# Patient Record
Sex: Female | Born: 1975
Health system: Southern US, Community
[De-identification: ages and names within clinical notes are randomized; demographics above are authoritative.]

## PROBLEM LIST (undated history)

## (undated) DIAGNOSIS — F32A Depression, unspecified: Secondary | ICD-10-CM

## (undated) DIAGNOSIS — R51 Headache: Secondary | ICD-10-CM

## (undated) DIAGNOSIS — F329 Major depressive disorder, single episode, unspecified: Secondary | ICD-10-CM

## (undated) DIAGNOSIS — R519 Headache, unspecified: Secondary | ICD-10-CM

## (undated) DIAGNOSIS — F419 Anxiety disorder, unspecified: Secondary | ICD-10-CM

## (undated) HISTORY — PX: TUBAL LIGATION: SHX77

## (undated) HISTORY — DX: Major depressive disorder, single episode, unspecified: F32.9

## (undated) HISTORY — DX: Headache, unspecified: R51.9

## (undated) HISTORY — PX: CHOLECYSTECTOMY: SHX55

## (undated) HISTORY — DX: Anxiety disorder, unspecified: F41.9

## (undated) HISTORY — DX: Depression, unspecified: F32.A

## (undated) HISTORY — DX: Headache: R51

---

## 2011-08-30 ENCOUNTER — Encounter: Payer: Self-pay | Admitting: Family Medicine

## 2011-08-30 ENCOUNTER — Ambulatory Visit (INDEPENDENT_AMBULATORY_CARE_PROVIDER_SITE_OTHER): Payer: Self-pay | Admitting: Family Medicine

## 2011-08-30 DIAGNOSIS — F329 Major depressive disorder, single episode, unspecified: Secondary | ICD-10-CM

## 2011-08-30 DIAGNOSIS — N943 Premenstrual tension syndrome: Secondary | ICD-10-CM

## 2011-08-30 DIAGNOSIS — F909 Attention-deficit hyperactivity disorder, unspecified type: Secondary | ICD-10-CM

## 2011-08-30 DIAGNOSIS — F32A Depression, unspecified: Secondary | ICD-10-CM

## 2011-08-30 DIAGNOSIS — F419 Anxiety disorder, unspecified: Secondary | ICD-10-CM

## 2011-08-30 DIAGNOSIS — F3289 Other specified depressive episodes: Secondary | ICD-10-CM

## 2011-08-30 DIAGNOSIS — F411 Generalized anxiety disorder: Secondary | ICD-10-CM

## 2011-08-30 MED ORDER — CLONAZEPAM 1 MG PO TABS: 0.5000 mg | ORAL_TABLET | Freq: Two times a day (BID) | ORAL | Status: DC | PRN

## 2011-08-30 MED ORDER — CITALOPRAM HYDROBROMIDE 20 MG PO TABS: 20.0000 mg | ORAL_TABLET | Freq: Every day | ORAL | Status: DC

## 2011-08-30 NOTE — Assessment & Plan Note (Signed)
Will try on klonopin 1 mg 1/2to 1 po day to twice a day

## 2011-08-30 NOTE — Patient Instructions (Signed)
Follow up in one month Use celexa 20 mg 1 po day Klonopin 1 mg 1/2 to 1 po daily or twice a day

## 2011-08-30 NOTE — Assessment & Plan Note (Signed)
Since sister did well on Celexa and she did not do well on Wellbutrin will switch to Celexa.

## 2011-08-31 NOTE — Progress Notes (Addendum)
  Subjective:    Patient ID: Katrina Mcclain, female    DOB: 12/09/1975, 35 y.o.   MRN: [id number]  Anxiety Onset was 1 to 5 years ago. The problem has been waxing and waning. Symptoms include decreased concentration, depressed mood, insomnia, nervous/anxious behavior and panic. Patient reports no suicidal ideas. Symptoms occur most days. The severity of symptoms is causing significant distress. The symptoms are aggravated by family issues, social activities and work stress. The quality of sleep is poor.   Risk factors include no known risk factors. Her past medical history is significant for anxiety/panic attacks and depression. Past treatments include non-SSRI antidepressants and benzodiazephines. The treatment provided moderate relief. Prior compliance problems include pharmacy issues and medication issues.  She reports also having a HX of ADHD and 2 of her sons with the condition but she is not on any medication for that now. Also reports PMS and the symptoms of her anxiety are worse when she is on her cycle like she is on now. She also states that her PMS got worse after her last child.  She has been on ativan a sister on xanax which she would like to try an wellbutrin that did nothelp.    Review of Systems  Psychiatric/Behavioral: Positive for sleep disturbance, dysphoric mood and decreased concentration. Negative for suicidal ideas. The patient is nervous/anxious and has insomnia.        Objective:   Physical Exam  Constitutional: She is oriented to person, place, and time. She appears well-developed and well-nourished.  HENT:  Head: Normocephalic and atraumatic.  Neck: Normal range of motion.  Neurological: She is alert and oriented to person, place, and time.  Psychiatric: She has a normal mood and affect.       Patient seemed anxious at time and some what nervous.          Assessment & Plan:  #1 depression #2 anxiety #3 PMS #4 ADHD Follow up in one month Use celexa 20 mg 1  po day Klonopin 1 mg 1/2 to 1 po daily or twice a day GAD  Was 6 + 15 for a total of 21 last 2 columns PHQ-9 6 Outpatient Encounter Prescriptions as of 08/30/2011  Medication Sig Dispense Refill  . ibuprofen (ADVIL,MOTRIN) 800 MG tablet Take 800 mg by mouth every 8 (eight) hours as needed.        . citalopram (CELEXA) 20 MG tablet Take 1 tablet (20 mg total) by mouth daily.  30 tablet  4  . clonazePAM (KLONOPIN) 1 MG tablet Take 0.5 tablets (0.5 mg total) by mouth 2 (two) times daily as needed for anxiety (may increase to a whole tablet if needed).  30 tablet  0

## 2011-09-27 ENCOUNTER — Ambulatory Visit (INDEPENDENT_AMBULATORY_CARE_PROVIDER_SITE_OTHER): Payer: Self-pay | Admitting: Family Medicine

## 2011-09-27 ENCOUNTER — Encounter: Payer: Self-pay | Admitting: Family Medicine

## 2011-09-27 VITALS — BP 107/57 | HR 62 | Ht 67.5 in | Wt 191.0 lb

## 2011-09-27 DIAGNOSIS — N926 Irregular menstruation, unspecified: Secondary | ICD-10-CM

## 2011-09-27 DIAGNOSIS — R32 Unspecified urinary incontinence: Secondary | ICD-10-CM

## 2011-09-27 NOTE — Progress Notes (Signed)
  Subjective:    Patient ID: Katrina Mcclain, female    DOB: [birth year], 35 y.o.   MRN: [id number]  Anxiety Presents for follow-up visit. Patient reports no chest pain, compulsions, decreased concentration, depressed mood, feeling of choking, hyperventilation, irritability, malaise, nausea, nervous/anxious behavior, panic, shortness of breath or suicidal ideas. Symptoms occur rarely. The severity of symptoms is mild. The quality of sleep is good. Nighttime awakenings: none.   Compliance with medications is 76-100%.      Review of Systems  Constitutional: Negative for irritability.  Respiratory: Negative for shortness of breath.   Cardiovascular: Negative for chest pain.  Gastrointestinal: Negative for nausea.  Genitourinary: Positive for frequency and menstrual problem.       Urinary incontenence when active has been noticed as well as some menstural irregularity  Psychiatric/Behavioral: Negative for suicidal ideas and decreased concentration. The patient is not nervous/anxious.   All other systems reviewed and are negative.       Objective:   Physical Exam  Constitutional: She is oriented to person, place, and time. She appears well-developed and well-nourished.  Neurological: She is alert and oriented to person, place, and time.  Skin: Skin is warm and dry.  Psychiatric: She has a normal mood and affect. Her behavior is normal.          Assessment & Plan:  Anxiety and depression doing both better. Will continue w/ both medications  Will have a PE in December or January Will need due to menstrual irregularity to get TSH [facility] LH Estrogen and Progesterone as well as CBC CMP, Lipid panel and A!C.

## 2011-09-27 NOTE — Patient Instructions (Signed)
Try the keglel exerciseAnxiety and Panic Attacks Your caregiver has informed you that you are having an anxiety or panic attack. There may be many forms of this. Most of the time these attacks come suddenly and without warning. They come at any time of day, including periods of sleep, and at any time of life. They may be strong and unexplained. Although panic attacks are very scary, they are physically harmless. Sometimes the cause of your anxiety is not known. Anxiety is a protective mechanism of the body in its fight or flight mechanism. Most of these perceived danger situations are actually nonphysical situations (such as anxiety over losing a job). CAUSES  The causes of an anxiety or panic attack are many. Panic attacks may occur in otherwise healthy people given a certain set of circumstances. There may be a genetic cause for panic attacks. Some medications may also have anxiety as a side effect. SYMPTOMS  Some of the most common feelings are:  Intense terror.   Dizziness, feeling faint.   Hot and cold flashes.   Fear of going crazy.   Feelings that nothing is real.   Sweating.   Shaking.   Chest pain or a fast heartbeat (palpitations).   Smothering, choking sensations.   Feelings of impending doom and that death is near.   Tingling of extremities, this may be from over-breathing.   Altered reality (derealization).   Being detached from yourself (depersonalization).  Several symptoms can be present to make up anxiety or panic attacks. DIAGNOSIS  The evaluation by your caregiver will depend on the type of symptoms you are experiencing. The diagnosis of anxiety or panic attack is made when no physical illness can be determined to be a cause of the symptoms. TREATMENT  Treatment to prevent anxiety and panic attacks may include:  Avoidance of circumstances that cause anxiety.   Reassurance and relaxation.   Regular exercise.   Relaxation therapies, such as yoga.    Psychotherapy with a psychiatrist or therapist.   Avoidance of caffeine, alcohol and illegal drugs.   Prescribed medication.  SEEK IMMEDIATE MEDICAL CARE IF:   You experience panic attack symptoms that are different than your usual symptoms.   You have any worsening or concerning symptoms.  Document Released: 11/07/2005 Document Revised: 07/20/2011 Document Reviewed: 03/11/2010 [facility] Patient Information 2012 [place], [place].Burch Procedure for Stress Incontinence Surgery may be helpful for a problem with bladder control (urinary incontinence). Some signs that you cannot control your bladder are loss of urine with:  Straining   Coughing   Sneezing   Laughing  The goal of surgery is to provide more support for the urethra and bladder. Surgery can improve bladder function. About 80% of women are cured with surgery. The Burch procedure is usually done under general or spinal anesthesia. The Burch procedure is described below with greater emphasis on the laparoscopic approach.  THE BURCH PROCEDURE  The Burch procedure is used to restore the bladder and urethra into their normal position. The bladder and urethra can change position through the course of normal aging and childbirth.  PROCEDURE   In this procedure, the surgeon lifts the wall of the vagina where the urethra is located.   The vaginal wall is stitched (sutured) to tissue near the pubic bone. This corrects the weakness so that the bladder remains stable during activities that might cause leakage such as coughing or sneezing.   The Burch procedure can be performed either through open abdominal surgery or by using a laparoscope.  A laparoscope is a special telescope-like instrument that your surgeon can look through. This allows the surgeon to operate through small cuts (incisions). With a laparoscopic procedure, 3 or 4 quarter-inch incisions in the belly (abdomen) and groin area are necessary.   One of the main advantages of  this procedure is a fast recovery time. This is because small incisions require less healing time. Women usually leave the hospital within 24 hours (same day surgery). They often return to normal activities within 7 to 14 days.  RISKS AND COMPLICATIONS  While there are many benefits of laparoscopy, there are relatively few risks. Your caregiver will discuss the risks with you before the procedure. Risks can include:  Bleeding   Infection   Injury to other organs   Anesthetic side effects  HOME CARE INSTRUCTIONS   Take all medications as directed.   You will likely have some mild discomfort in the throat if general anesthesia was used. This is from the breathing tube that was placed in your throat while you were sleeping. You will also have some mild abdominal discomfort and discomfort from the incisions where the instruments were placed into your abdomen. Only take over-the-counter or prescription medicines for pain, discomfort, or fever as directed by your caregiver.   Resume daily activities as directed. Showers are preferred. Resume sexual activities as directed by your surgeon.  SEEK IMMEDIATE MEDICAL CARE IF:   There is increasing abdominal pain.   You develop pain in your shoulders that gets more and more severe. Some pain is common and expected because of the gas inserted into your abdomen during the procedure.   You feel light headed or faint.   You have chills or fever. If you develop a high temperature, record these and have them ready to show your caregiver when seen.   You develop bleeding or drainage from the suture sites or vagina following surgery.  MAKE SURE YOU:   Understand these instructions.   Will watch your condition.   Will get help right away if you are not doing well or get worse.  Document Released: 01/28/2004 Document Revised: 07/20/2011 Document Reviewed: 07/01/2009 [facility] Patient Information 2012 [place], [place].Urinary Incontinence Your doctor  wants you to have this information about urinary incontinence. This is the inability to keep urine in your body until you decide to release it. CAUSES  Prostate gland enlargement is a common cause of urinary incontinence. But there are many different causes for losing urinary control. They include:  Medicines.   Infections.   Prostate problems.   Surgery.   Neurological diseases.   Emotional factors.  DIAGNOSIS  Evaluating the cause of incontinence is important in choosing the best treatment. This may require:  An ultrasound exam.   Kidney and bladder X-rays.   Cystoscopy. This is an exam of the bladder using a narrow scope.  TREATMENT  For incontinent patients, normal daily hygiene and using changing pads or adult diapers regularly will prevent offensive odors and skin damage from the moisture. Changing your medicines may help control incontinence. Your caregiver may prescribe some medicines to help you regain control. Avoid caffeine. It can over-stimulate the bladder. Use the bathroom regularly. Try about every 2 to 3 hours even if you do not feel the need. Take time to empty your bladder completely. After urinating, wait a minute. Then try to urinate again. External devices used to catch urine or an indwelling urine catheter (Foley catheter) may be needed as well. Some prostate gland problems require surgery  to correct. Call your caregiver for more information. Document Released: 12/15/2004 Document Revised: 07/20/2011 Document Reviewed: 12/10/2008 [facility] Patient Information 2012 [place], [place].

## 2011-10-05 ENCOUNTER — Other Ambulatory Visit: Payer: Self-pay | Admitting: Family Medicine

## 2011-10-11 ENCOUNTER — Other Ambulatory Visit: Payer: Self-pay | Admitting: *Deleted

## 2011-10-11 DIAGNOSIS — N943 Premenstrual tension syndrome: Secondary | ICD-10-CM

## 2011-10-11 DIAGNOSIS — F329 Major depressive disorder, single episode, unspecified: Secondary | ICD-10-CM

## 2011-10-11 DIAGNOSIS — F32A Depression, unspecified: Secondary | ICD-10-CM

## 2011-10-11 MED ORDER — CLONAZEPAM 1 MG PO TABS: 0.5000 mg | ORAL_TABLET | Freq: Two times a day (BID) | ORAL | Status: DC | PRN

## 2011-10-28 ENCOUNTER — Telehealth: Payer: Self-pay | Admitting: *Deleted

## 2011-10-28 DIAGNOSIS — Z13220 Encounter for screening for lipoid disorders: Secondary | ICD-10-CM

## 2011-10-28 DIAGNOSIS — R5383 Other fatigue: Secondary | ICD-10-CM

## 2011-10-28 DIAGNOSIS — N951 Menopausal and female climacteric states: Secondary | ICD-10-CM

## 2011-10-28 NOTE — Telephone Encounter (Signed)
Pt needs lab order

## 2011-10-29 LAB — CBC WITH DIFFERENTIAL/PLATELET
Basophils Relative: 0 % (ref 0–1)
HCT: 38 % (ref 36.0–46.0)
Hemoglobin: 12.1 g/dL (ref 12.0–15.0)
Lymphs Abs: 2.6 10*3/uL (ref 0.7–4.0)
MCHC: 31.8 g/dL (ref 30.0–36.0)
Monocytes Absolute: 0.3 10*3/uL (ref 0.1–1.0)
Monocytes Relative: 4 % (ref 3–12)
Neutro Abs: 4.7 10*3/uL (ref 1.7–7.7)
Neutrophils Relative %: 61 % (ref 43–77)
RBC: 4.08 MIL/uL (ref 3.87–5.11)

## 2011-10-29 LAB — COMPLETE METABOLIC PANEL WITH GFR
Albumin: 4.3 g/dL (ref 3.5–5.2)
Alkaline Phosphatase: 79 U/L (ref 39–117)
BUN: 10 mg/dL (ref 6–23)
CO2: 27 mEq/L (ref 19–32)
Calcium: 9.3 mg/dL (ref 8.4–10.5)
Chloride: 100 mEq/L (ref 96–112)
GFR, Est African American: >89 mL/min
GFR, Est Non African American: >89 mL/min
Glucose, Bld: 81 mg/dL (ref 70–99)
Potassium: 4.8 mEq/L (ref 3.5–5.3)
Sodium: 138 mEq/L (ref 135–145)
Total Protein: 6.8 g/dL (ref 6.0–8.3)

## 2011-10-29 LAB — LIPID PANEL
Cholesterol: 231 mg/dL — ABNORMAL HIGH (ref 0–200)
LDL Cholesterol: 135 mg/dL — ABNORMAL HIGH (ref 0–99)
Triglycerides: 220 mg/dL — ABNORMAL HIGH (ref <150)

## 2011-10-29 LAB — FOLLICLE STIMULATING HORMONE: FSH: 6 m[IU]/mL

## 2011-10-29 LAB — PROGESTERONE: Progesterone: 0.2 ng/mL

## 2011-10-29 LAB — TSH: TSH: 1.686 u[IU]/mL (ref 0.350–4.500)

## 2011-10-29 LAB — LUTEINIZING HORMONE: LH: 3.6 m[IU]/mL

## 2011-11-01 ENCOUNTER — Other Ambulatory Visit (HOSPITAL_COMMUNITY)
Admission: RE | Admit: 2011-11-01 | Discharge: 2011-11-01 | Disposition: A | Discharge status: Home / Self Care | Payer: Self-pay | Source: Ambulatory Visit | Attending: Family Medicine | Admitting: Family Medicine

## 2011-11-01 ENCOUNTER — Encounter: Payer: Self-pay | Admitting: Family Medicine

## 2011-11-01 ENCOUNTER — Ambulatory Visit (INDEPENDENT_AMBULATORY_CARE_PROVIDER_SITE_OTHER): Payer: Self-pay | Admitting: Family Medicine

## 2011-11-01 VITALS — BP 108/68 | HR 62 | Ht 68.0 in | Wt 197.0 lb

## 2011-11-01 DIAGNOSIS — Z1231 Encounter for screening mammogram for malignant neoplasm of breast: Secondary | ICD-10-CM

## 2011-11-01 DIAGNOSIS — F32A Depression, unspecified: Secondary | ICD-10-CM

## 2011-11-01 DIAGNOSIS — F419 Anxiety disorder, unspecified: Secondary | ICD-10-CM

## 2011-11-01 DIAGNOSIS — N943 Premenstrual tension syndrome: Secondary | ICD-10-CM

## 2011-11-01 DIAGNOSIS — N926 Irregular menstruation, unspecified: Secondary | ICD-10-CM

## 2011-11-01 DIAGNOSIS — Z01419 Encounter for gynecological examination (general) (routine) without abnormal findings: Secondary | ICD-10-CM | POA: Insufficient documentation

## 2011-11-01 DIAGNOSIS — F411 Generalized anxiety disorder: Secondary | ICD-10-CM

## 2011-11-01 DIAGNOSIS — Z23 Encounter for immunization: Secondary | ICD-10-CM

## 2011-11-01 DIAGNOSIS — F3289 Other specified depressive episodes: Secondary | ICD-10-CM

## 2011-11-01 DIAGNOSIS — Z0000 Encounter for general adult medical examination without abnormal findings: Secondary | ICD-10-CM

## 2011-11-01 DIAGNOSIS — L989 Disorder of the skin and subcutaneous tissue, unspecified: Secondary | ICD-10-CM

## 2011-11-01 DIAGNOSIS — N939 Abnormal uterine and vaginal bleeding, unspecified: Secondary | ICD-10-CM

## 2011-11-01 DIAGNOSIS — Z803 Family history of malignant neoplasm of breast: Secondary | ICD-10-CM

## 2011-11-01 DIAGNOSIS — R42 Dizziness and giddiness: Secondary | ICD-10-CM

## 2011-11-01 DIAGNOSIS — E785 Hyperlipidemia, unspecified: Secondary | ICD-10-CM

## 2011-11-01 DIAGNOSIS — F329 Major depressive disorder, single episode, unspecified: Secondary | ICD-10-CM

## 2011-11-01 LAB — POCT URINALYSIS DIPSTICK
Bilirubin, UA: NEGATIVE
Leukocytes, UA: NEGATIVE
Nitrite, UA: NEGATIVE
Protein, UA: NEGATIVE
Urobilinogen, UA: 0.2
pH, UA: 5.5

## 2011-11-01 MED ORDER — CLONAZEPAM 1 MG PO TABS: 1.0000 mg | ORAL_TABLET | Freq: Two times a day (BID) | ORAL | Status: DC | PRN

## 2011-11-01 MED ORDER — CITALOPRAM HYDROBROMIDE 20 MG PO TABS: 20.0000 mg | ORAL_TABLET | Freq: Every day | ORAL | Status: DC

## 2011-11-01 MED ORDER — TETANUS-DIPHTH-ACELL PERTUSSIS 5-2.5-18.5 LF-MCG/0.5 IM SUSP: 0.5000 mL | Freq: Once | INTRAMUSCULAR | Status: DC

## 2011-11-01 NOTE — Progress Notes (Signed)
Subjective:    Patient ID: Katrina Mcclain, female    DOB: [birth year], 35 y.o.   MRN: [id number]  HPI Health Maintence Hx tof hyperlipidemia in the past  Review of Systems  HENT: Positive for sinus pressure.        VERTIGO  Respiratory: Negative for shortness of breath.   Cardiovascular: Negative for chest pain.  Genitourinary: Positive for menstrual problem.       Increase clotting during her cycle  Skin:       Mole on L side of temple area has gotten bigger Mole returnedemoved several year on her L back have returned  Psychiatric/Behavioral: Positive for dysphoric mood. The patient is nervous/anxious.        ANXIETY   No Known Allergies History   Social History  . Marital Status: Single    Spouse Name: N/A    Number of Children: N/A  . Years of Education: N/A   Occupational History  . Not on file.   Social History Main Topics  . Smoking status: Never Smoker   . Smokeless tobacco: Never Used  . Alcohol Use: Yes  . Drug Use: No  . Sexually Active: Yes    Birth Control/ Protection: Surgical   Other Topics Concern  . Not on file   Social History Narrative  . No narrative on file   Family History  Problem Relation Age of Onset  . Cancer Mother   . Ovarian cysts Sister    Past Medical History  Diagnosis Date  . Depression   . Anxiety    Past Surgical History  Procedure Date  . Cesarean section   . Cholecystectomy   . Tubal ligation         Objective:   Physical Exam  Constitutional: She is oriented to person, place, and time. She appears well-developed and well-nourished. No distress.  HENT:  Head: Normocephalic and atraumatic.  Right Ear: External ear normal.  Left Ear: External ear normal.  Nose: Nose normal.  Mouth/Throat: Oropharynx is clear and moist.  Eyes: Pupils are equal, round, and reactive to light.       Fundoscopic WNL  Neck: Normal range of motion. Neck supple. No tracheal deviation present. No thyromegaly present.  Cardiovascular:  Normal rate, regular rhythm and intact distal pulses.   No murmur heard. Pulmonary/Chest: Effort normal and breath sounds normal. No respiratory distress. She has no wheezes. She has no rales.  Abdominal: Soft. Bowel sounds are normal.       Surgical scars present  Genitourinary: Uterus normal. Rectal exam shows no external hemorrhoid, no fissure, no mass, no tenderness and anal tone normal. Guaiac negative stool. No breast swelling, tenderness, discharge or bleeding. Uterus is not enlarged and not tender. Right adnexum displays no mass, no tenderness and no fullness. Left adnexum displays no mass and no tenderness. There is bleeding around the vagina. No vaginal discharge found.  Musculoskeletal: Normal range of motion. She exhibits no edema and no tenderness.  Lymphadenopathy:    She has cervical adenopathy.  Neurological: She is alert and oriented to person, place, and time. She has normal reflexes. No cranial nerve deficit.  Skin: Skin is warm and dry. She is not diaphoretic.       Prominent mole on L temple area growing Mole on R lower back   Results for orders placed in visit on 11/01/11  POCT URINALYSIS DIPSTICK      Component Value Range   Color, UA yellow     Clarity, UA  clear     Glucose, UA neg     Bilirubin, UA neg     Ketones, UA neg     Spec Grav, UA >=1.030     Blood, UA small     pH, UA 5.5     Protein, UA neg     Urobilinogen, UA 0.2     Nitrite, UA neg     Leukocytes, UA Negative         Assessment & Plan:   #1 health maintenance #2 hyperlipidemia patient request natural supplements and diet to see if it can come under control #3 vertigo appears to be resolved #4 family history of breast cancer will obtain screening mammogram #5 anxiety depression renew her Klonopin change the current doses reflect what she can take #6 skin lesions because of the facial lesion involved will refer to dermatologist and also asked him to reevaluate the mole on her right back as  well. #7Menstrual bleeding hormonal levels satisfactory there is some blood in vaginal vault but she's just finished a cycle no signs of any ovarian cyst or an enlarged uterus this time we'll continue to watch her menstrual abnormal bleeding #8 indications will be given believe she needs a tetanus and flu today #9

## 2011-11-01 NOTE — Patient Instructions (Signed)
Hypertriglyceridemia  Diet for High blood levels of Triglycerides Most fats in food are triglycerides. Triglycerides in your blood are stored as fat in your body. High levels of triglycerides in your blood may put you at a greater risk for heart disease and stroke.  Normal triglyceride levels are less than 150 mg/dL. Borderline high levels are 150-199 mg/dl. High levels are 200 - 499 mg/dL, and very high triglyceride levels are greater than 500 mg/dL. The decision to treat high triglycerides is generally based on the level. For people with borderline or high triglyceride levels, treatment includes weight loss and exercise. Drugs are recommended for people with very high triglyceride levels. Many people who need treatment for high triglyceride levels have metabolic syndrome. This syndrome is a collection of disorders that often include: insulin resistance, high blood pressure, blood clotting problems, high cholesterol and triglycerides. TESTING PROCEDURE FOR TRIGLYCERIDES  You should not eat 4 hours before getting your triglycerides measured. The normal range of triglycerides is between 10 and 250 milligrams per deciliter (mg/dl). Some people may have extreme levels (1000 or above), but your triglyceride level may be too high if it is above 150 mg/dl, depending on what other risk factors you have for heart disease.   People with high blood triglycerides may also have high blood cholesterol levels. If you have high blood cholesterol as well as high blood triglycerides, your risk for heart disease is probably greater than if you only had high triglycerides. High blood cholesterol is one of the main risk factors for heart disease.  CHANGING YOUR DIET  Your weight can affect your blood triglyceride level. If you are more than 20% above your ideal body weight, you may be able to lower your blood triglycerides by losing weight. Eating less and exercising regularly is the best way to combat this. Fat provides  more calories than any other food. The best way to lose weight is to eat less fat. Only 30% of your total calories should come from fat. Less than 7% of your diet should come from saturated fat. A diet low in fat and saturated fat is the same as a diet to decrease blood cholesterol. By eating a diet lower in fat, you may lose weight, lower your blood cholesterol, and lower your blood triglyceride level.  Eating a diet low in fat, especially saturated fat, may also help you lower your blood triglyceride level. Ask your dietitian to help you figure how much fat you can eat based on the number of calories your caregiver has prescribed for you.  Exercise, in addition to helping with weight loss may also help lower triglyceride levels.   Alcohol can increase blood triglycerides. You may need to stop drinking alcoholic beverages.   Too much carbohydrate in your diet may also increase your blood triglycerides. Some complex carbohydrates are necessary in your diet. These may include bread, rice, potatoes, other starchy vegetables and cereals.   Reduce "simple" carbohydrates. These may include pure sugars, candy, honey, and jelly without losing other nutrients. If you have the kind of high blood triglycerides that is affected by the amount of carbohydrates in your diet, you will need to eat less sugar and less high-sugar foods. Your caregiver can help you with this.   Adding 2-4 grams of fish oil (EPA+ DHA) may also help lower triglycerides. Speak with your caregiver before adding any supplements to your regimen.  Following the Diet  Maintain your ideal weight. Your caregivers can help you with a diet. Generally,   eating less food and getting more exercise will help you lose weight. Joining a weight control group may also help. Ask your caregivers for a good weight control group in your area.  Eat low-fat foods instead of high-fat foods. This can help you lose weight too.  These foods are lower in fat. Eat MORE  of these:   Dried beans, peas, and lentils.   Egg whites.   Low-fat cottage cheese.   Fish.   Lean cuts of meat, such as round, sirloin, rump, and flank (cut extra fat off meat you fix).   Whole grain breads, cereals and pasta.   Skim and nonfat dry milk.   Low-fat yogurt.   Poultry without the skin.   Cheese made with skim or part-skim milk, such as mozzarella, parmesan, farmers', ricotta, or pot cheese.  These are higher fat foods. Eat LESS of these:   Whole milk and foods made from whole milk, such as American, blue, cheddar, monterey jack, and swiss cheese   High-fat meats, such as luncheon meats, sausages, knockwurst, bratwurst, hot dogs, ribs, corned beef, ground pork, and regular ground beef.   Fried foods.  Limit saturated fats in your diet. Substituting unsaturated fat for saturated fat may decrease your blood triglyceride level. You will need to read package labels to know which products contain saturated fats.  These foods are high in saturated fat. Eat LESS of these:   Fried pork skins.   Whole milk.   Skin and fat from poultry.   Palm oil.   Butter.   Shortening.   Cream cheese.   Bacon.   Margarines and baked goods made from listed oils.   Vegetable shortenings.   Chitterlings.   Fat from meats.   Coconut oil.   Palm kernel oil.   Lard.   Cream.   Sour cream.   Fatback.   Coffee whiteners and non-dairy creamers made with these oils.   Cheese made from whole milk.  Use unsaturated fats (both polyunsaturated and monounsaturated) moderately. Remember, even though unsaturated fats are better than saturated fats; you still want a diet low in total fat.  These foods are high in unsaturated fat:   Canola oil.   Sunflower oil.   Mayonnaise.   Almonds.   Peanuts.   Pine nuts.   Margarines made with these oils.   Safflower oil.   Olive oil.   Avocados.   Cashews.   Peanut butter.   Sunflower seeds.   Soybean oil.     Peanut oil.   Olives.   Pecans.   Walnuts.   Pumpkin seeds.  Avoid sugar and other high-sugar foods. This will decrease carbohydrates without decreasing other nutrients. Sugar in your food goes rapidly to your blood. When there is excess sugar in your blood, your liver may use it to make more triglycerides. Sugar also contains calories without other important nutrients.  Eat LESS of these:   Sugar, brown sugar, powdered sugar, jam, jelly, preserves, honey, syrup, molasses, pies, candy, cakes, cookies, frosting, pastries, colas, soft drinks, punches, fruit drinks, and regular gelatin.   Avoid alcohol. Alcohol, even more than sugar, may increase blood triglycerides. In addition, alcohol is high in calories and low in nutrients. Ask for sparkling water, or a diet soft drink instead of an alcoholic beverage.  Suggestions for planning and preparing meals   Bake, broil, grill or roast meats instead of frying.   Remove fat from meats and skin from poultry before cooking.   Add spices,   herbs, lemon juice or vinegar to vegetables instead of salt, rich sauces or gravies.   Use a non-stick skillet without fat or use no-stick sprays.   Cool and refrigerate stews and broth. Then remove the hardened fat floating on the surface before serving.   Refrigerate meat drippings and skim off fat to make low-fat gravies.   Serve more fish.   Use less butter, margarine and other high-fat spreads on bread or vegetables.   Use skim or reconstituted non-fat dry milk for cooking.   Cook with low-fat cheeses.   Substitute low-fat yogurt or cottage cheese for all or part of the sour cream in recipes for sauces, dips or congealed salads.   Use half yogurt/half mayonnaise in salad recipes.   Substitute evaporated skim milk for cream. Evaporated skim milk or reconstituted non-fat dry milk can be whipped and substituted for whipped cream in certain recipes.   Choose fresh fruits for dessert instead of  high-fat foods such as pies or cakes. Fruits are naturally low in fat.  When Dining Out   Order low-fat appetizers such as fruit or vegetable juice, pasta with vegetables or tomato sauce.   Select clear, rather than cream soups.   Ask that dressings and gravies be served on the side. Then use less of them.   Order foods that are baked, broiled, poached, steamed, stir-fried, or roasted.   Ask for margarine instead of butter, and use only a small amount.   Drink sparkling water, unsweetened tea or coffee, or diet soft drinks instead of alcohol or other sweet beverages.  QUESTIONS AND ANSWERS ABOUT OTHER FATS IN THE BLOOD: SATURATED FAT, TRANS FAT, AND CHOLESTEROL What is trans fat? Trans fat is a type of fat that is formed when vegetable oil is hardened through a process called hydrogenation. This process helps makes foods more solid, gives them shape, and prolongs their shelf life. Trans fats are also called hydrogenated or partially hydrogenated oils.  What do saturated fat, trans fat, and cholesterol in foods have to do with heart disease? Saturated fat, trans fat, and cholesterol in the diet all raise the level of LDL "bad" cholesterol in the blood. The higher the LDL cholesterol, the greater the risk for coronary heart disease (CHD). Saturated fat and trans fat raise LDL similarly.  What foods contain saturated fat, trans fat, and cholesterol? High amounts of saturated fat are found in animal products, such as fatty cuts of meat, chicken skin, and full-fat dairy products like butter, whole milk, cream, and cheese, and in tropical vegetable oils such as palm, palm kernel, and coconut oil. Trans fat is found in some of the same foods as saturated fat, such as vegetable shortening, some margarines (especially hard or stick margarine), crackers, cookies, baked goods, fried foods, salad dressings, and other processed foods made with partially hydrogenated vegetable oils. Small amounts of trans fat  also occur naturally in some animal products, such as milk products, beef, and lamb. Foods high in cholesterol include liver, other organ meats, egg yolks, shrimp, and full-fat dairy products. How can I use the new food label to make heart-healthy food choices? Check the Nutrition Facts panel of the food label. Choose foods lower in saturated fat, trans fat, and cholesterol. For saturated fat and cholesterol, you can also use the Percent Daily Value (%DV): 5% DV or less is low, and 20% DV or more is high. (There is no %DV for trans fat.) Use the Nutrition Facts panel to choose foods low in   saturated fat and cholesterol, and if the trans fat is not listed, read the ingredients and limit products that list shortening or hydrogenated or partially hydrogenated vegetable oil, which tend to be high in trans fat. POINTS TO REMEMBER: YOU NEED A LITTLE TLC (THERAPEUTIC LIFESTYLE CHANGES)  Discuss your risk for heart disease with your caregivers, and take steps to reduce risk factors.   Change your diet. Choose foods that are low in saturated fat, trans fat, and cholesterol.   Add exercise to your daily routine if it is not already being done. Participate in physical activity of moderate intensity, like brisk walking, for at least 30 minutes on most, and preferably all days of the week. No time? Break the 30 minutes into three, 10-minute segments during the day.   Stop smoking. If you do smoke, contact your caregiver to discuss ways in which they can help you quit.   Do not use street drugs.   Maintain a normal weight.   Maintain a healthy blood pressure.   Keep up with your blood work for checking the fats in your blood as directed by your caregiver.  Document Released: 08/25/2004 Document Revised: 07/20/2011 Document Reviewed: 03/23/2009 [employer] Patient Information 2012 [employer]. 

## 2012-02-20 ENCOUNTER — Other Ambulatory Visit: Payer: Self-pay | Admitting: *Deleted

## 2012-02-20 DIAGNOSIS — N943 Premenstrual tension syndrome: Secondary | ICD-10-CM

## 2012-02-20 DIAGNOSIS — F419 Anxiety disorder, unspecified: Secondary | ICD-10-CM

## 2012-02-20 DIAGNOSIS — F32A Depression, unspecified: Secondary | ICD-10-CM

## 2012-02-20 DIAGNOSIS — F329 Major depressive disorder, single episode, unspecified: Secondary | ICD-10-CM

## 2012-02-20 MED ORDER — CITALOPRAM HYDROBROMIDE 20 MG PO TABS: 20.0000 mg | ORAL_TABLET | Freq: Every day | ORAL | Status: DC

## 2012-03-27 ENCOUNTER — Ambulatory Visit (INDEPENDENT_AMBULATORY_CARE_PROVIDER_SITE_OTHER): Payer: Self-pay | Admitting: Family Medicine

## 2012-03-27 ENCOUNTER — Encounter: Payer: Self-pay | Admitting: Family Medicine

## 2012-03-27 VITALS — BP 108/63 | HR 75 | Ht 68.0 in | Wt 199.0 lb

## 2012-03-27 DIAGNOSIS — F329 Major depressive disorder, single episode, unspecified: Secondary | ICD-10-CM

## 2012-03-27 DIAGNOSIS — B351 Tinea unguium: Secondary | ICD-10-CM

## 2012-03-27 DIAGNOSIS — Z0000 Encounter for general adult medical examination without abnormal findings: Secondary | ICD-10-CM

## 2012-03-27 DIAGNOSIS — E785 Hyperlipidemia, unspecified: Secondary | ICD-10-CM

## 2012-03-27 DIAGNOSIS — F32A Depression, unspecified: Secondary | ICD-10-CM

## 2012-03-27 DIAGNOSIS — F3289 Other specified depressive episodes: Secondary | ICD-10-CM

## 2012-03-27 DIAGNOSIS — N943 Premenstrual tension syndrome: Secondary | ICD-10-CM

## 2012-03-27 LAB — LIPID PANEL
LDL Cholesterol: 119 mg/dL — ABNORMAL HIGH (ref 0–99)
Triglycerides: 165 mg/dL — ABNORMAL HIGH (ref <150)

## 2012-03-27 MED ORDER — CLONAZEPAM 1 MG PO TABS: 1.0000 mg | ORAL_TABLET | Freq: Two times a day (BID) | ORAL | Status: DC | PRN

## 2012-03-27 MED ORDER — CICLOPIROX 8 % EX SOLN: Freq: Every day | CUTANEOUS | Status: AC

## 2012-03-27 MED ORDER — ESCITALOPRAM OXALATE 10 MG PO TABS: 10.0000 mg | ORAL_TABLET | Freq: Every day | ORAL | Status: DC

## 2012-03-27 NOTE — Patient Instructions (Signed)
Ringworm, Nail A fungal infection of the nail (tinea unguium/onychomycosis) is common. It is common as the visible part of the nail is composed of dead cells which have no blood supply to help prevent infection. It occurs because fungi are everywhere and will pick any opportunity to grow on any dead material. Because nails are very slow growing they require up to 2 years of treatment with anti-fungal medications. The entire nail back to the base is infected. This includes approximately ? of the nail which you cannot see. If your caregiver has prescribed a medication by mouth, take it every day and as directed. No progress will be seen for at least 6 to 9 months. Do not be disappointed! Because fungi live on dead cells with little or no exposure to blood supply, medication delivery to the infection is slow; thus the cure is slow. It is also why you can observe no progress in the first 6 months. The nail becoming cured is the base of the nail, as it has the blood supply. Topical medication such as creams and ointments are usually not effective. Important in successful treatment of nail fungus is closely following the medication regimen that your doctor prescribes. Sometimes you and your caregiver may elect to speed up this process by surgical removal of all the nails. Even this may still require 6 to 9 months of additional oral medications. See your caregiver as directed. Remember there will be no visible improvement for at least 6 months. See your caregiver sooner if other signs of infection (redness and swelling) develop. Document Released: 11/04/2000 Document Revised: 10/27/2011 Document Reviewed: 01/13/2009 [employer] Patient Information 2012 [employer].Plantar Fasciitis Plantar fasciitis is a common condition that causes foot pain. It is soreness (inflammation) of the band of tough fibrous tissue on the bottom of the foot that runs from the heel bone (calcaneus) to the ball of the foot. The cause of  this soreness may be from excessive standing, poor fitting shoes, running on hard surfaces, being overweight, having an abnormal walk, or overuse (this is common in runners) of the painful foot or feet. It is also common in aerobic exercise dancers and ballet dancers. SYMPTOMS  Most people with plantar fasciitis complain of:  Severe pain in the morning on the bottom of their foot especially when taking the first steps out of bed. This pain recedes after a few minutes of walking.   Severe pain is experienced also during walking following a long period of inactivity.   Pain is worse when walking barefoot or up stairs  DIAGNOSIS   Your caregiver will diagnose this condition by examining and feeling your foot.   Special tests such as X-rays of your foot, are usually not needed.  PREVENTION   Consult a sports medicine professional before beginning a new exercise program.   Walking programs offer a good workout. With walking there is a lower chance of overuse injuries common to runners. There is less impact and less jarring of the joints.   Begin all new exercise programs slowly. If problems or pain develop, decrease the amount of time or distance until you are at a comfortable level.   Wear good shoes and replace them regularly.   Stretch your foot and the heel cords at the back of the ankle (Achilles tendon) both before and after exercise.   Run or exercise on even surfaces that are not hard. For example, asphalt is better than pavement.   Do not run barefoot on hard surfaces.     If using a treadmill, vary the incline.   Do not continue to workout if you have foot or joint problems. Seek professional help if they do not improve.  HOME CARE INSTRUCTIONS   Avoid activities that cause you pain until you recover.   Use ice or cold packs on the problem or painful areas after working out.   Only take over-the-counter or prescription medicines for pain, discomfort, or fever as directed by  your caregiver.   Soft shoe inserts or athletic shoes with air or gel sole cushions may be helpful.   If problems continue or become more severe, consult a sports medicine caregiver or your own health care provider. Cortisone is a potent anti-inflammatory medication that may be injected into the painful area. You can discuss this treatment with your caregiver.  MAKE SURE YOU:   Understand these instructions.   Will watch your condition.   Will get help right away if you are not doing well or get worse.  Document Released: 08/02/2001 Document Revised: 10/27/2011 Document Reviewed: 10/01/2008 [employer] Patient Information 2012 [employer]. 

## 2012-03-27 NOTE — Progress Notes (Signed)
  Subjective:    Patient ID: Katrina Mcclain, female    DOB: 10/22/1976, 36 y.o.   MRN: [id number]  HPI  Problem #1 she needs to have her cholesterol rechecked to see if she needs to be placed on medication. #2 sleep disorder/depression/anxiety. She reports having vivid dreams at night leaving her exhausted in the morning. She tried reducing her Klonopin but that did not help. The Celexa works well for the depression but she misses a dose or 2 she then becomes depressed and experienced symptoms similar to PMS. She asks if there is any evaluation for her sleep disturbance. Explained to her at the sleep Center are more for apnea. #3 discoloration of the right toenail   #4 pain in the bottom of her right heel after exercising and first thing in the morning when she gets up. Turns out when she lived in [place] she bought some hard inserts from of a store was afraid he use them because she was afraid they would her her foot. #5 patient needs to have a mammogram done   Review of Systems  Musculoskeletal: Positive for gait problem.  All other systems reviewed and are negative.      BP 108/63  Pulse 75  Ht 5\' 8"  (1.727 m)  Wt 199 lb (90.266 kg)  BMI 30.26 kg/m2 Objective:   Physical Exam  Constitutional: She is oriented to person, place, and time. She appears well-developed and well-nourished.  HENT:  Head: Normocephalic.  Musculoskeletal: Normal range of motion. Edema: Over the palmar surface of the right foot near the heel especially was increased tenderness and pain similar to the pain she has an morning when she steps on that foot.  Neurological: She is alert and oriented to person, place, and time.  Skin: Skin is warm and dry.  Psychiatric: She has a normal mood and affect.      Results for orders placed in visit on 03/27/12  LIPID PANEL      Component Value Range   Cholesterol 198  0 - 200 (mg/dL)   Triglycerides 562 (*) <150 (mg/dL)   HDL 46  >13 (mg/dL)   Total CHOL/HDL Ratio  4.3     VLDL 33  0 - 40 (mg/dL)   LDL Cholesterol 086 (*) 0 - 99 (mg/dL)      Assessment & Plan:   #1 hyperlipidemia. Will check her cholesterol lipid panel and let her know if she needs to be placed on cholesterol medication. #2 sleep disorder/depression/anxiety. She is currently on Celexa. Because of the increased side effects with Celexa we'll switch to Lexapro 10 mg and see if it makes a difference continue with the Klonopin. #3 fungal infection of the toenail. Right big toenail with early fungal infection treat with Penlac solution as directed #4 probable plantars fasciitis. Recommend she uses in steps she obtained years ago and return if problems persist.  #5 health maintenance. Patient did not get a mammogram this winter she requests getting it even though she is under 40 her mother did have breast cancer. Order mammogram again but asked her to check with radiology department downstairs to see if they take her insurance. Return in 4 months for followup.

## 2012-04-09 ENCOUNTER — Encounter: Payer: Self-pay | Admitting: Emergency Medicine

## 2012-04-09 ENCOUNTER — Emergency Department (INDEPENDENT_AMBULATORY_CARE_PROVIDER_SITE_OTHER)
Admission: EM | Admit: 2012-04-09 | Discharge: 2012-04-09 | Disposition: A | Discharge status: Home / Self Care | Payer: Self-pay | Source: Home / Self Care | Attending: Family Medicine | Admitting: Family Medicine

## 2012-04-09 ENCOUNTER — Emergency Department: Admit: 2012-04-09 | Discharge: 2012-04-09 | Disposition: A | Discharge status: Home / Self Care | Payer: Self-pay

## 2012-04-09 DIAGNOSIS — M722 Plantar fascial fibromatosis: Secondary | ICD-10-CM

## 2012-04-09 DIAGNOSIS — M76822 Posterior tibial tendinitis, left leg: Secondary | ICD-10-CM

## 2012-04-09 DIAGNOSIS — M7662 Achilles tendinitis, left leg: Secondary | ICD-10-CM

## 2012-04-09 NOTE — ED Notes (Signed)
Left ankle pain x 4 days while in Zumba class

## 2012-04-09 NOTE — ED Provider Notes (Signed)
History     CSN: [id number]  Arrival date & time 04/09/12  1156   First MD Initiated Contact with Patient 04/09/12 1212      Chief Complaint  Patient presents with  . Ankle Pain     HPI Comments: Patient complains of pain in her left posterior ankle that began three days ago while she was in a Zumba class.  She had difficulty finishing her workout and has had persistent soreness in her left posterior heel over the past 3 days.  She normally does Zumba about 3 times per week, but the week prior she had exercised every day.  She notes that she also has had mild plantar fasciitis in her left heel in the past.  Patient is a 36 y.o. female presenting with ankle pain. The history is provided by the patient.  Ankle Pain This is a new problem. Episode onset: 3 days ago. The problem occurs constantly. The problem has not changed since onset.Associated symptoms comments: none. The symptoms are aggravated by walking. The symptoms are relieved by nothing. She has tried a cold compress (Ibuprofen) for the symptoms. The treatment provided mild relief.    Past Medical History  Diagnosis Date  . Depression   . Anxiety     Past Surgical History  Procedure Date  . Cesarean section   . Cholecystectomy   . Tubal ligation     Family History  Problem Relation Age of Onset  . Cancer Mother   . Ovarian cysts Sister     History  Substance Use Topics  . Smoking status: Never Smoker   . Smokeless tobacco: Never Used  . Alcohol Use: Yes    OB History    Grav Para Term Preterm Abortions TAB SAB Ect Mult Living                  Review of Systems  All other systems reviewed and are negative.    Allergies  Review of patient's allergies indicates no known allergies.  Home Medications   Current Outpatient Rx  Name Route Sig Dispense Refill  . CICLOPIROX 8 % EX SOLN Topical Apply topically at bedtime. Apply over nail and surrounding skin. Apply daily over previous coat. After seven (7)  days, may remove with alcohol and continue cycle. 6.6 mL 0  . CLONAZEPAM 1 MG PO TABS Oral Take 1 tablet (1 mg total) by mouth 2 (two) times daily as needed for anxiety (may increase to a whole tablet if needed). 180 tablet 1  . ESCITALOPRAM OXALATE 10 MG PO TABS Oral Take 1 tablet (10 mg total) by mouth daily. 90 tablet 3  . IBUPROFEN 800 MG PO TABS Oral Take 800 mg by mouth every 8 (eight) hours as needed.        BP 106/71  Pulse 72  Temp(Src) 98.5 F (36.9 C) (Oral)  Resp 16  Ht 5\' 6"  (1.676 m)  Wt 200 lb (90.719 kg)  BMI 32.28 kg/m2  SpO2 98%  Physical Exam  Nursing note and vitals reviewed. Constitutional: She is oriented to person, place, and time. She appears well-developed and well-nourished.       Patient is mildly obese (BMI 32.3)  HENT:  Head: Normocephalic.  Eyes: Conjunctivae are normal. Pupils are equal, round, and reactive to light.  Musculoskeletal: Normal range of motion.       Left ankle: She exhibits normal range of motion, no swelling, no ecchymosis, no deformity, no laceration and normal pulse. tenderness. Achilles tendon  exhibits pain.       Left foot: She exhibits tenderness. She exhibits normal range of motion, no bony tenderness, no swelling, normal capillary refill, no crepitus, no deformity and no laceration.       Feet:       Left foot and ankle reveal mild tenderness over medial and lateral edges of distal achilles tendon.  The achilles tendon is intact.  There is tenderness over both medial and lateral aspects of the calcaneus.  There is tenderness just posterior to the medial malleolus.  Distal Neurovascular function is intact.   Neurological: She is alert and oriented to person, place, and time.  Skin: Skin is warm and dry. No rash noted.    ED Course  Procedures none   Dg Os Calcis Left  04/09/2012  *RADIOLOGY REPORT*  Clinical Data: Heel injury 3 days ago.  Heel pain.  LEFT OS CALCIS - 2+ VIEW  Comparison: None.  Findings: A small distal  Achilles spur is noted.  The calcaneus appears otherwise unremarkable.  No subtalar joint abnormality is observed.  IMPRESSION:  1.  Minimal Achilles calcaneal spur.   Otherwise, no significant abnormality identified.  Original Report Authenticated By: Dellia Cloud, M.D.     1. Posterior tibial tendonitis, left   2. Achilles tendonitis, left   3. Plantar fasciitis, left       MDM    Obtain shoe inserts with good arch support and heel cup.  Apply ice pack several times daily.  Begin Ibuprofen 200mg , 4 tabs every 8 hours with food.  Begin range of motion and stretching exercises as per instruction sheets ([facility] information and instruction handout given).  Consider obtaining night splints.  Avoid Zumba for now. Followup with [facility] Medicine Clinic if not improving about two weeks.        Lattie Haw, MD 04/10/12 1501

## 2012-04-10 NOTE — Discharge Instructions (Signed)
Obtain shoe inserts with good arch support and heel cup.  Apply ice pack several times daily.  Begin Ibuprofen 200mg , 4 tabs every 8 hours with food.  Begin range of motion and stretching exercises as per instruction sheets.  Avoid Zumba for now.

## 2012-04-12 ENCOUNTER — Ambulatory Visit (INDEPENDENT_AMBULATORY_CARE_PROVIDER_SITE_OTHER): Payer: Self-pay | Admitting: Family Medicine

## 2012-04-12 ENCOUNTER — Encounter: Payer: Self-pay | Admitting: Family Medicine

## 2012-04-12 VITALS — BP 114/75 | HR 78 | Ht 66.0 in | Wt 199.0 lb

## 2012-04-12 DIAGNOSIS — M79609 Pain in unspecified limb: Secondary | ICD-10-CM

## 2012-04-12 DIAGNOSIS — M79672 Pain in left foot: Secondary | ICD-10-CM

## 2012-04-12 NOTE — Patient Instructions (Signed)
Your symptoms are better currently - the location suggests one of three possibilities (plantar fasciitis, achilles tendinopathy, posterior tibial tendinopathy). They are all treated similarly so it's not critical that we isolate which of the three this is. Take tylenol or aleve as needed for pain  Plantar fascia stretch for 20-30 seconds (do 3 of these) in morning Lowering/raise on a step exercises 3 x 10 once or twice a day - this is very important for long term recovery. Can add heel walks, toe walks forward and backward as well Internal rotation exercise with theraband or garbage can 3 sets of 10 once a day. Ice heel for 15 minutes as needed. Avoid flat shoes/barefoot walking as much as possible. Arch straps have been shown to help with pain. Good arch support and heel lifts are important to help rest the painful areas except when doing the exercises. Steroid injection is a consideration for short term pain relief if you are struggling. Physical therapy is also an option. Follow up with me in 5-6 weeks. Activities as tolerated.

## 2012-04-13 ENCOUNTER — Encounter: Payer: Self-pay | Admitting: Family Medicine

## 2012-04-13 DIAGNOSIS — M79672 Pain in left foot: Secondary | ICD-10-CM | POA: Insufficient documentation

## 2012-04-13 NOTE — Assessment & Plan Note (Signed)
difficult to discern exactly what the cause is of her pain because she feels better today.  However, there is a lot of crossover in treatment for plantar fasciitis, achilles tendinopathy, and post tib tendinopathy.  Start home exercise program - shown stretches and exercises (see instructions).  Arch straps for support, discussed OTC inserts with good arch support.  Icing, nsaids/tylenol as needed.  Avoid barefoot walking.  PT, orthotics, injection options if not improving as expected.

## 2012-04-13 NOTE — Progress Notes (Signed)
Subjective:    Patient ID: Katrina Mcclain, female    DOB: Jun 01, 1976, 36 y.o.   MRN: [id number]  PCP: Corinda Gubler  HPI 36 yo F here for left foot pain.  Patient reports she's had problems with her left plantar fascia for several months. Then last Friday during Zumba she felt pain intensify plantar, medial and posterior left ankle/foot. No swelling or bruising. No apparent injury. Has been using ice, ibuprofen, elevating, and ACE wrap since then. Some improvement. Previously for plantar fascia has used night splints and tennis ball massage. Shoes with a heel and arch support have been beneficial.  Past Medical History  Diagnosis Date  . Depression   . Anxiety     Current Outpatient Prescriptions on File Prior to Visit  Medication Sig Dispense Refill  . ciclopirox (PENLAC) 8 % solution Apply topically at bedtime. Apply over nail and surrounding skin. Apply daily over previous coat. After seven (7) days, may remove with alcohol and continue cycle.  6.6 mL  0  . clonazePAM (KLONOPIN) 1 MG tablet Take 1 tablet (1 mg total) by mouth 2 (two) times daily as needed for anxiety (may increase to a whole tablet if needed).  180 tablet  1  . escitalopram (LEXAPRO) 10 MG tablet Take 1 tablet (10 mg total) by mouth daily.  90 tablet  3  . ibuprofen (ADVIL,MOTRIN) 800 MG tablet Take 800 mg by mouth every 8 (eight) hours as needed.          Past Surgical History  Procedure Date  . Cesarean section   . Cholecystectomy   . Tubal ligation     No Known Allergies  History   Social History  . Marital Status: Single    Spouse Name: N/A    Number of Children: N/A  . Years of Education: N/A   Occupational History  . Not on file.   Social History Main Topics  . Smoking status: Never Smoker   . Smokeless tobacco: Never Used  . Alcohol Use: Yes  . Drug Use: No  . Sexually Active: Yes    Birth Control/ Protection: Surgical   Other Topics Concern  . Not on file   Social History Narrative  .  No narrative on file    Family History  Problem Relation Age of Onset  . Cancer Mother   . Ovarian cysts Sister     BP 114/75  Pulse 78  Ht 5\' 6"  (1.676 m)  Wt 199 lb (90.266 kg)  BMI 32.12 kg/m2  Review of Systems See HPI above.    Objective:   Physical Exam Gen: NAD  Left foot/ankle: Mod pes planus.  Minimal transverse arch breakdown. No other gross deformity, swelling, ecchymoses FROM with 5/5 strength all directions without pain currently (states feels improved today). No focal TTP but points to distal achilles, post tib, and plantar fascia insertion as areas she has pain when this comes on. Negative ant drawer and talar tilt.   Negative syndesmotic compression. Thompsons test negative. NV intact distally.    Assessment & Plan:  1. Left foot pain - difficult to discern exactly what the cause is of her pain because she feels better today.  However, there is a lot of crossover in treatment for plantar fasciitis, achilles tendinopathy, and post tib tendinopathy.  Start home exercise program - shown stretches and exercises (see instructions).  Arch straps for support, discussed OTC inserts with good arch support.  Icing, nsaids/tylenol as needed.  Avoid barefoot walking.  PT,  orthotics, injection options if not improving as expected.

## 2012-04-24 ENCOUNTER — Encounter: Payer: Self-pay | Admitting: Family Medicine

## 2012-04-24 ENCOUNTER — Ambulatory Visit (INDEPENDENT_AMBULATORY_CARE_PROVIDER_SITE_OTHER): Payer: Self-pay | Admitting: Family Medicine

## 2012-04-24 VITALS — BP 110/79 | HR 84 | Temp 97.8°F | Ht 66.0 in | Wt 200.0 lb

## 2012-04-24 DIAGNOSIS — S239XXA Sprain of unspecified parts of thorax, initial encounter: Secondary | ICD-10-CM

## 2012-04-24 DIAGNOSIS — IMO0002 Reserved for concepts with insufficient information to code with codable children: Secondary | ICD-10-CM

## 2012-04-24 MED ORDER — DICLOFENAC SODIUM 75 MG PO TBEC: 75.0000 mg | DELAYED_RELEASE_TABLET | Freq: Two times a day (BID) | ORAL | Status: AC

## 2012-04-24 MED ORDER — CYCLOBENZAPRINE HCL 10 MG PO TABS: 10.0000 mg | ORAL_TABLET | Freq: Three times a day (TID) | ORAL | Status: AC | PRN

## 2012-04-24 NOTE — Patient Instructions (Signed)
You have a thoracic strain (pulled muscle/spasms in your upper back deep to the shoulder blade). Start PT as directed and do home stretches/exercises that they show you. Consider massage, active release in addition to this. Vicodin as needed for severe pain (no driving on this). Voltaren 75mg  twice a day with food x 7 days then as needed. Flexeril as needed for spasms. Follow up with me as discussed previously for your ankle.

## 2012-04-26 ENCOUNTER — Encounter: Payer: Self-pay | Admitting: Family Medicine

## 2012-04-26 DIAGNOSIS — IMO0002 Reserved for concepts with insufficient information to code with codable children: Secondary | ICD-10-CM | POA: Insufficient documentation

## 2012-04-26 NOTE — Progress Notes (Signed)
Subjective:    Patient ID: Katrina Mcclain, female    DOB: [birth year], 36 y.o.   MRN: [id number]  PCP: Corinda Gubler  Shoulder Pain    36 yo F previously seen for left foot pain now here with left shoulder pain.  5/23: Patient reports she's had problems with her left plantar fascia for several months. Then last Friday during Zumba she felt pain intensify plantar, medial and posterior left ankle/foot. No swelling or bruising. No apparent injury. Has been using ice, ibuprofen, elevating, and ACE wrap since then. Some improvement. Previously for plantar fascia has used night splints and tennis ball massage. Shoes with a heel and arch support have been beneficial.  6/4: Patient reports she was doing a lot of arm work in [employer] class yesterday. No acute injury while doing this but felt sore yesterday evening. Then woke up this morning with fairly severe left posterior shoulder/scapular pain. No radiation into left arm. Some radiation across back to right side though. No numbness or tingling. Tried heat and a flexeril.  Past Medical History  Diagnosis Date  . Depression   . Anxiety     Current Outpatient Prescriptions on File Prior to Visit  Medication Sig Dispense Refill  . ciclopirox (PENLAC) 8 % solution Apply topically at bedtime. Apply over nail and surrounding skin. Apply daily over previous coat. After seven (7) days, may remove with alcohol and continue cycle.  6.6 mL  0  . clonazePAM (KLONOPIN) 1 MG tablet Take 1 tablet (1 mg total) by mouth 2 (two) times daily as needed for anxiety (may increase to a whole tablet if needed).  180 tablet  1  . escitalopram (LEXAPRO) 10 MG tablet Take 1 tablet (10 mg total) by mouth daily.  90 tablet  3  . ibuprofen (ADVIL,MOTRIN) 800 MG tablet Take 800 mg by mouth every 8 (eight) hours as needed.          Past Surgical History  Procedure Date  . Cesarean section   . Cholecystectomy   . Tubal ligation     No Known Allergies  History    Social History  . Marital Status: Single    Spouse Name: N/A    Number of Children: N/A  . Years of Education: N/A   Occupational History  . Not on file.   Social History Main Topics  . Smoking status: Never Smoker   . Smokeless tobacco: Never Used  . Alcohol Use: Yes  . Drug Use: No  . Sexually Active: Yes    Birth Control/ Protection: Surgical   Other Topics Concern  . Not on file   Social History Narrative  . No narrative on file    Family History  Problem Relation Age of Onset  . Cancer Mother   . Ovarian cysts Sister     BP 110/79  Pulse 84  Temp(Src) 97.8 F (36.6 C) (Oral)  Ht 5\' 6"  (1.676 m)  Wt 200 lb (90.719 kg)  BMI 32.28 kg/m2  Review of Systems  See HPI above.    Objective:   Physical Exam  Gen: NAD  L shoulder: No swelling, ecchymoses.  No gross deformity. No TTP. FROM with negative painful arc. Negative Hawkins, Neers. Negative Speeds, Yergasons. Strength 5/5 with empty can and resisted internal/external rotation. Negative apprehension. NV intact distally.  Upper back: No gross deformity. Spasms felt just medial to left scapular medial border TTP in this area but primary area of pain is deep to scapula, worse with shoulder ROM in  this area. No winging. NVI distally BUEs.    Assessment & Plan:  1. Thoracic strain - No evidence of shoulder pathology or radiculopathy.  Start PT and HEP.  Vicodin (#60 q6h prn) as needed for severe pain, voltaren bid, flexeril prn.  Heat for spasms.  Will consider massage and/or active release as well.  Should improve over the next few weeks.

## 2012-04-26 NOTE — Assessment & Plan Note (Signed)
No evidence of shoulder pathology or radiculopathy.  Start PT and HEP.  Vicodin (#60 q6h prn) as needed for severe pain, voltaren bid, flexeril prn.  Heat for spasms.  Will consider massage and/or active release as well.  Should improve over the next few weeks.

## 2012-05-08 ENCOUNTER — Ambulatory Visit: Payer: Self-pay | Attending: Family Medicine | Admitting: Physical Therapy

## 2012-05-08 ENCOUNTER — Ambulatory Visit: Payer: Self-pay

## 2012-05-08 DIAGNOSIS — IMO0001 Reserved for inherently not codable concepts without codable children: Secondary | ICD-10-CM | POA: Insufficient documentation

## 2012-05-08 DIAGNOSIS — M546 Pain in thoracic spine: Secondary | ICD-10-CM | POA: Insufficient documentation

## 2012-05-08 DIAGNOSIS — M6281 Muscle weakness (generalized): Secondary | ICD-10-CM | POA: Insufficient documentation

## 2012-05-10 ENCOUNTER — Ambulatory Visit: Payer: Self-pay | Admitting: Physical Therapy

## 2012-05-14 ENCOUNTER — Ambulatory Visit: Payer: Self-pay | Admitting: Physical Therapy

## 2012-05-15 ENCOUNTER — Telehealth: Payer: Self-pay | Admitting: *Deleted

## 2012-05-15 ENCOUNTER — Ambulatory Visit
Admission: RE | Admit: 2012-05-15 | Discharge: 2012-05-15 | Disposition: A | Discharge status: Home / Self Care | Payer: Self-pay | Source: Ambulatory Visit | Attending: Family Medicine | Admitting: Family Medicine

## 2012-05-15 DIAGNOSIS — Z0000 Encounter for general adult medical examination without abnormal findings: Secondary | ICD-10-CM

## 2012-05-15 MED ORDER — CITALOPRAM HYDROBROMIDE 20 MG PO TABS: 20.0000 mg | ORAL_TABLET | Freq: Every day | ORAL | Status: DC

## 2012-05-15 NOTE — Telephone Encounter (Signed)
I am not convinced this is the Lexapro since it is a purified form of Celexa. The reason the dosage is 1/2 is  because instead of having the most active component and the mirror component which is not nearly as as potent and efficacious that has much more side effects. Okay I don't really expect you to explain pharmacology to her. But between you and me that is illogical . She may be placed back on Celexa but if she still having trouble then she needs to be seen.

## 2012-05-15 NOTE — Telephone Encounter (Signed)
Pt states that the Lexapro is causing her to have tight muscles and cramping. States in the last month she has had 2 injuries from doing Zumba since she has went on the Lexapro Pt is wanting to go back on the Celexa instead. Please advise.

## 2012-05-15 NOTE — Telephone Encounter (Signed)
LM for pt that med was being sent to [facility].

## 2012-05-17 ENCOUNTER — Ambulatory Visit: Payer: Self-pay | Admitting: Physical Therapy

## 2012-05-21 ENCOUNTER — Encounter: Payer: Self-pay | Admitting: Physical Therapy

## 2012-05-22 ENCOUNTER — Ambulatory Visit: Payer: Self-pay | Admitting: Family Medicine

## 2012-05-22 ENCOUNTER — Encounter: Payer: Self-pay | Admitting: Physical Therapy

## 2012-05-22 ENCOUNTER — Ambulatory Visit (INDEPENDENT_AMBULATORY_CARE_PROVIDER_SITE_OTHER): Payer: Self-pay | Admitting: Family Medicine

## 2012-05-22 ENCOUNTER — Encounter: Payer: Self-pay | Admitting: Family Medicine

## 2012-05-22 VITALS — BP 114/79 | HR 81 | Temp 98.2°F | Ht 66.0 in | Wt 197.0 lb

## 2012-05-22 DIAGNOSIS — IMO0002 Reserved for concepts with insufficient information to code with codable children: Secondary | ICD-10-CM

## 2012-05-22 DIAGNOSIS — S239XXA Sprain of unspecified parts of thorax, initial encounter: Secondary | ICD-10-CM

## 2012-05-23 ENCOUNTER — Encounter: Payer: Self-pay | Admitting: Family Medicine

## 2012-05-23 NOTE — Progress Notes (Signed)
Subjective:    Patient ID: Katrina Mcclain, female    DOB: 11-24-1975, 36 y.o.   MRN: [id number]  PCP: Corinda Gubler  Shoulder Pain    36 yo F here for f/u left shoulder pain.  5/23: Patient reports she's had problems with her left plantar fascia for several months. Then last Friday during Zumba she felt pain intensify plantar, medial and posterior left ankle/foot. No swelling or bruising. No apparent injury. Has been using ice, ibuprofen, elevating, and ACE wrap since then. Some improvement. Previously for plantar fascia has used night splints and tennis ball massage. Shoes with a heel and arch support have been beneficial.  6/4: Patient reports she was doing a lot of arm work in [employer] class yesterday. No acute injury while doing this but felt sore yesterday evening. Then woke up this morning with fairly severe left posterior shoulder/scapular pain. No radiation into left arm. Some radiation across back to right side though. No numbness or tingling. Tried heat and a flexeril.  7/2: Patient reports she has improved with PT (has only done 5 visits to date). Had massage yesterday which also helped. No longer taking voltaren, occasionally takes flexeril. Continues in [place] and just knows she's going to be sore after this. No radiation into arms - stays mainly in back between shoulder blades. No other complaints.  Past Medical History  Diagnosis Date  . Depression   . Anxiety     Current Outpatient Prescriptions on File Prior to Visit  Medication Sig Dispense Refill  . ciclopirox (PENLAC) 8 % solution Apply topically at bedtime. Apply over nail and surrounding skin. Apply daily over previous coat. After seven (7) days, may remove with alcohol and continue cycle.  6.6 mL  0  . citalopram (CELEXA) 20 MG tablet Take 1 tablet (20 mg total) by mouth daily.  30 tablet  1  . clonazePAM (KLONOPIN) 1 MG tablet Take 1 tablet (1 mg total) by mouth 2 (two) times daily as needed for anxiety (may  increase to a whole tablet if needed).  180 tablet  1  . cyclobenzaprine (FLEXERIL) 10 MG tablet Take 1 tablet (10 mg total) by mouth every 8 (eight) hours as needed for muscle spasms.  60 tablet  1  . diclofenac (VOLTAREN) 75 MG EC tablet Take 1 tablet (75 mg total) by mouth 2 (two) times daily with a meal.  60 tablet  2  . escitalopram (LEXAPRO) 10 MG tablet Take 1 tablet (10 mg total) by mouth daily.  90 tablet  3  . ibuprofen (ADVIL,MOTRIN) 800 MG tablet Take 800 mg by mouth every 8 (eight) hours as needed.          Past Surgical History  Procedure Date  . Cesarean section   . Cholecystectomy   . Tubal ligation     No Known Allergies  History   Social History  . Marital Status: Single    Spouse Name: N/A    Number of Children: N/A  . Years of Education: N/A   Occupational History  . Not on file.   Social History Main Topics  . Smoking status: Never Smoker   . Smokeless tobacco: Never Used  . Alcohol Use: Yes  . Drug Use: No  . Sexually Active: Yes    Birth Control/ Protection: Surgical   Other Topics Concern  . Not on file   Social History Narrative  . No narrative on file    Family History  Problem Relation Age of Onset  .  Cancer Mother   . Ovarian cysts Sister     BP 114/79  Pulse 81  Temp 98.2 F (36.8 C) (Oral)  Ht 5\' 6"  (1.676 m)  Wt 197 lb (89.359 kg)  BMI 31.80 kg/m2  Review of Systems  See HPI above.    Objective:   Physical Exam  Gen: NAD  L shoulder: No swelling, ecchymoses.  No gross deformity. No TTP. FROM with negative painful arc. Negative Hawkins, Neers. Strength 5/5 with empty can and resisted internal/external rotation. Negative apprehension. NV intact distally.  Upper back: No gross deformity, swelling, bruising. TTP medial to left scapular medial border.  No midline TTP.   No winging. NVI distally BUEs.    Assessment & Plan:  1. Thoracic strain - Improved with PT but has only done 5 visits to date.  Disc pathology  would be extremely unusual in this area - do not think MRI would be helpful as I believe this is purely muscular.  Continue PT and home exercise program.  Advised to consider active release, continued massage.  Has flexeril as needed and to use heat as needed for spasms.  F/u prn.

## 2012-05-25 NOTE — Assessment & Plan Note (Signed)
Improved with PT but has only done 5 visits to date.  Disc pathology would be extremely unusual in this area - do not think MRI would be helpful as I believe this is purely muscular.  Continue PT and home exercise program.  Advised to consider active release, continued massage.  Has flexeril as needed and to use heat as needed for spasms.  F/u prn.

## 2012-05-27 ENCOUNTER — Emergency Department
Admission: EM | Admit: 2012-05-27 | Discharge: 2012-05-27 | Disposition: A | Discharge status: Home / Self Care | Payer: Self-pay | Source: Home / Self Care | Attending: Family Medicine | Admitting: Family Medicine

## 2012-05-27 DIAGNOSIS — N3000 Acute cystitis without hematuria: Secondary | ICD-10-CM

## 2012-05-27 DIAGNOSIS — R300 Dysuria: Secondary | ICD-10-CM

## 2012-05-27 LAB — POCT URINALYSIS DIP (MANUAL ENTRY)
Blood, UA: NEGATIVE
Glucose, UA: NEGATIVE
Leukocytes, UA: NEGATIVE
Nitrite, UA: NEGATIVE

## 2012-05-27 MED ORDER — NITROFURANTOIN MONOHYD MACRO 100 MG PO CAPS: 100.0000 mg | ORAL_CAPSULE | Freq: Two times a day (BID) | ORAL | Status: AC

## 2012-05-27 MED ORDER — PHENAZOPYRIDINE HCL 200 MG PO TABS: 200.0000 mg | ORAL_TABLET | Freq: Three times a day (TID) | ORAL | Status: AC

## 2012-05-27 NOTE — ED Notes (Signed)
Katrina Mcclain complains of frequent and painful urination since this am. She has had bladder infections before and this is how she felt.

## 2012-05-27 NOTE — ED Provider Notes (Signed)
History     CSN: [id number]  Arrival date & time 05/27/12  1227   First MD Initiated Contact with Patient 05/27/12 1338      Chief Complaint  Patient presents with  . Dysuria    this am     HPI Comments: Patient complains of onset this morning of dysuria, urgency, and mild lower back ache.  No nausea/vomiting.  No fevers, chills, and sweats.  No pelvic pain.  She has a history of tubal ligation.  She states that she is preparing to go out of town.  Patient is a 36 y.o. female presenting with dysuria. The history is provided by the patient.  Dysuria  This is a new problem. The current episode started 3 to 5 hours ago. The problem occurs every urination. The problem has not changed since onset.The quality of the pain is described as burning. The pain is mild. There has been no fever. Associated symptoms include frequency, hesitancy and urgency. Pertinent negatives include no chills, no sweats, no nausea, no vomiting, no discharge, no hematuria, no possible pregnancy and no flank pain. She has tried nothing for the symptoms.    Past Medical History  Diagnosis Date  . Depression   . Anxiety     Past Surgical History  Procedure Date  . Cesarean section   . Cholecystectomy   . Tubal ligation     Family History  Problem Relation Age of Onset  . Cancer Mother     Breast  . Ovarian cysts Sister     History  Substance Use Topics  . Smoking status: Never Smoker   . Smokeless tobacco: Never Used  . Alcohol Use: Yes    OB History    Grav Para Term Preterm Abortions TAB SAB Ect Mult Living                  Review of Systems  Constitutional: Negative for chills.  Gastrointestinal: Negative for nausea and vomiting.  Genitourinary: Positive for dysuria, hesitancy, urgency and frequency. Negative for hematuria and flank pain.   No sore throat No cough No pleuritic pain No wheezing No nasal congestion No post-nasal drainage No sinus pain/pressure No itchy/red eyes No  earache No hemoptysis No SOB No fever/chills No nausea No vomiting No abdominal pain No diarrhea + urinary symptoms No vaginal discharge or pelvic pain No skin rashes No fatigue No myalgias No headache   Allergies  Review of patient's allergies indicates no known allergies.  Home Medications   Current Outpatient Rx  Name Route Sig Dispense Refill  . CITALOPRAM HYDROBROMIDE 20 MG PO TABS Oral Take 1 tablet (20 mg total) by mouth daily. 30 tablet 1  . CLONAZEPAM 1 MG PO TABS Oral Take 1 tablet (1 mg total) by mouth 2 (two) times daily as needed for anxiety (may increase to a whole tablet if needed). 180 tablet 1  . CYCLOBENZAPRINE HCL 10 MG PO TABS Oral Take 1 tablet (10 mg total) by mouth every 8 (eight) hours as needed for muscle spasms. 60 tablet 1  . DICLOFENAC SODIUM 75 MG PO TBEC Oral Take 1 tablet (75 mg total) by mouth 2 (two) times daily with a meal. 60 tablet 2  . IBUPROFEN 800 MG PO TABS Oral Take 800 mg by mouth every 8 (eight) hours as needed.      Marland Kitchen CICLOPIROX 8 % EX SOLN Topical Apply topically at bedtime. Apply over nail and surrounding skin. Apply daily over previous coat. After seven (7)  days, may remove with alcohol and continue cycle. 6.6 mL 0  . ESCITALOPRAM OXALATE 10 MG PO TABS Oral Take 1 tablet (10 mg total) by mouth daily. 90 tablet 3  . NITROFURANTOIN MONOHYD MACRO 100 MG PO CAPS Oral Take 1 capsule (100 mg total) by mouth 2 (two) times daily. 14 capsule 0  . PHENAZOPYRIDINE HCL 200 MG PO TABS Oral Take 1 tablet (200 mg total) by mouth 3 (three) times daily. 6 tablet 0    BP 103/71  Pulse 109  Temp 98.2 F (36.8 C) (Oral)  Resp 16  Ht 5\' 6"  (1.676 m)  Wt 201 lb (91.173 kg)  BMI 32.44 kg/m2  SpO2 99%  Physical Exam Nursing notes and Vital Signs reviewed. Appearance:  Patient appears stated age, and in no acute distress.  Patient is morbidly obese (BMI 32.5) Eyes:  Pupils are equal, round, and reactive to light and accomodation.  Extraocular  movement is intact.  Conjunctivae are not inflamed  Pharynx:  Normal Neck:  Supple.   No adenopathy Lungs:  Clear to auscultation.  Breath sounds are equal.  Heart:  Regular rate and rhythm without murmurs, rubs, or gallops.  Abdomen:  Nontender without masses or hepatosplenomegaly.  Bowel sounds are present.  No CVA or flank tenderness.  Extremities:  No edema.  No calf tenderness Skin:  No rash present.   ED Course  Procedures  none   Labs Reviewed  POCT URINALYSIS DIP (MANUAL ENTRY):  negative  URINE CULTURE pending      1. Dysuria   2. Acute cystitis; although urinalysis is negative, symptoms are consistent with cystitis       MDM  Urine culture pending.  Begin Macrobid and Pyridium. Increase fluid intake. Followup at an Urgent Care center while out of town if not improving.        Lattie Haw, MD 05/31/12 [phone]

## 2012-05-29 LAB — URINE CULTURE: Colony Count: 25000

## 2012-06-01 ENCOUNTER — Telehealth: Payer: Self-pay

## 2012-06-01 NOTE — ED Notes (Signed)
I called patient to see how she was doing. She states she is feeling fine and she did not start the antibiotic. I advised her to call back if she has any questions or concerns.

## 2012-10-23 ENCOUNTER — Emergency Department
Admission: EM | Admit: 2012-10-23 | Discharge: 2012-10-23 | Disposition: A | Discharge status: Home / Self Care | Payer: Self-pay | Source: Home / Self Care | Attending: Family Medicine | Admitting: Family Medicine

## 2012-10-23 ENCOUNTER — Encounter: Payer: Self-pay | Admitting: *Deleted

## 2012-10-23 DIAGNOSIS — J020 Streptococcal pharyngitis: Secondary | ICD-10-CM

## 2012-10-23 DIAGNOSIS — R509 Fever, unspecified: Secondary | ICD-10-CM

## 2012-10-23 LAB — POCT RAPID STREP A (OFFICE): Rapid Strep A Screen: POSITIVE — AB

## 2012-10-23 MED ORDER — PENICILLIN G BENZATHINE 1200000 UNIT/2ML IM SUSP
1.2000 10*6.[IU] | Freq: Once | INTRAMUSCULAR | Status: AC
  Administered 2012-10-23: 1.2 10*6.[IU] via INTRAMUSCULAR

## 2012-10-23 NOTE — ED Provider Notes (Signed)
History     CSN: [id number]  Arrival date & time 10/23/12  1110   First MD Initiated Contact with Patient 10/23/12 1118      Chief Complaint  Patient presents with  . Sore Throat  . Fever   HPI SORE THROAT  Onset: 2 days  Description: sore throat, earache, generalized malaise, fever, chills  Modifying factors: none   Symptoms  Fever:  yes URI symptoms: no Cough: no Headache: yes Rash:  no Swollen glands:   yes Recent Strep Exposure: possible LUQ pain: no Heartburn/brash: no Allergy Symptoms: no  Red Flags STD exposure: no Breathing difficulty: no Drooling: no Trismus: no   Past Medical History  Diagnosis Date  . Depression   . Anxiety     Past Surgical History  Procedure Date  . Cesarean section   . Cholecystectomy   . Tubal ligation     Family History  Problem Relation Age of Onset  . Cancer Mother     Breast  . Ovarian cysts Sister     History  Substance Use Topics  . Smoking status: Never Smoker   . Smokeless tobacco: Never Used  . Alcohol Use: No    OB History    Grav Para Term Preterm Abortions TAB SAB Ect Mult Living                  Review of Systems  All other systems reviewed and are negative.    Allergies  Review of patient's allergies indicates no known allergies.  Home Medications   Current Outpatient Rx  Name  Route  Sig  Dispense  Refill  . CITALOPRAM HYDROBROMIDE 20 MG PO TABS   Oral   Take 1 tablet (20 mg total) by mouth daily.   30 tablet   1   . CLONAZEPAM 1 MG PO TABS   Oral   Take 1 tablet (1 mg total) by mouth 2 (two) times daily as needed for anxiety (may increase to a whole tablet if needed).   180 tablet   1   . CYCLOBENZAPRINE HCL 10 MG PO TABS   Oral   Take 1 tablet (10 mg total) by mouth every 8 (eight) hours as needed for muscle spasms.   60 tablet   1   . DICLOFENAC SODIUM 75 MG PO TBEC   Oral   Take 1 tablet (75 mg total) by mouth 2 (two) times daily with a meal.   60 tablet   2    . ESCITALOPRAM OXALATE 10 MG PO TABS   Oral   Take 1 tablet (10 mg total) by mouth daily.   90 tablet   3   . IBUPROFEN 800 MG PO TABS   Oral   Take 800 mg by mouth every 8 (eight) hours as needed.             BP 111/74  Pulse 111  Temp 101.4 F (38.6 C) (Tympanic)  Resp 16  Ht 5' 6.5" (1.689 m)  Wt 204 lb (92.534 kg)  BMI 32.43 kg/m2  SpO2 99%  Physical Exam  Constitutional: She appears well-developed and well-nourished.  HENT:  Head: Normocephalic and atraumatic.  Right Ear: External ear normal.  Left Ear: External ear normal.  Mouth/Throat: Oropharyngeal exudate present.       +nasal erythema, rhinorrhea bilaterally, + post oropharyngeal erythema    Eyes: Conjunctivae normal are normal. Pupils are equal, round, and reactive to light.  Neck: Normal range of motion. Neck supple.  Cardiovascular: Normal rate and regular rhythm.   Pulmonary/Chest: Effort normal and breath sounds normal.  Abdominal: Soft.  Musculoskeletal: Normal range of motion.  Lymphadenopathy:    She has cervical adenopathy.  Neurological: She is alert.  Skin: Skin is warm.    ED Course  Procedures (including critical care time)  Labs Reviewed  POCT RAPID STREP A (OFFICE) - Abnormal; Notable for the following:    Rapid Strep A Screen Positive (*)     All other components within normal limits  POCT INFLUENZA A/B   No results found.   1. Fever   2. Strep throat       MDM  Rapid strep positive.  Will treat with bicillin 1.2 million units IM x1  Discussed infectious and general red flags.  Follow up as needed.     The patient and/or caregiver has been counseled thoroughly with regard to treatment plan and/or medications prescribed including dosage, schedule, interactions, rationale for use, and possible side effects and they verbalize understanding. Diagnoses and expected course of recovery discussed and will return if not improved as expected or if the condition worsens. Patient  and/or caregiver verbalized understanding.             Doree Albee, MD 10/23/12 1146

## 2012-10-23 NOTE — ED Notes (Signed)
Patient c/o sore throat, fever and body aches x yesterday. No meds taken today, took Nyquil last night.

## 2012-12-24 ENCOUNTER — Encounter: Payer: Self-pay | Admitting: Physician Assistant

## 2012-12-24 ENCOUNTER — Ambulatory Visit (INDEPENDENT_AMBULATORY_CARE_PROVIDER_SITE_OTHER): Payer: Self-pay | Admitting: Physician Assistant

## 2012-12-24 VITALS — BP 110/62 | HR 85 | Wt 200.0 lb

## 2012-12-24 DIAGNOSIS — J40 Bronchitis, not specified as acute or chronic: Secondary | ICD-10-CM

## 2012-12-24 MED ORDER — AZITHROMYCIN 250 MG PO TABS: ORAL_TABLET | ORAL | Status: DC

## 2012-12-24 MED ORDER — FLUCONAZOLE 150 MG PO TABS: 150.0000 mg | ORAL_TABLET | Freq: Once | ORAL | Status: DC

## 2012-12-24 NOTE — Progress Notes (Signed)
  Subjective:    Patient ID: Katrina Mcclain, female    DOB: 1976/07/27, 37 y.o.   MRN: [id number]  HPI Patient is a 37 yo who presents to the clinic with cough and chest tightness for over a week. At first she had a ST but has resolved. Her productive cough has continued with green sputum. Her right ear is very congested and aches. Denies any ear discharge. She has a lot of nasal congestion.She denies any sinus pressure, wheezing, SOB. She does not have any associated lung disorders such as asthma. She has tried honey lemon cough drops and dimetapp.    Review of Systems  Constitutional: Negative.   Cardiovascular: Negative.   Neurological: Negative.   Psychiatric/Behavioral: Negative.        Objective:   Physical Exam  Constitutional: She is oriented to person, place, and time. She appears well-developed and well-nourished.  HENT:  Head: Normocephalic and atraumatic.  Right Ear: External ear normal.  Left Ear: External ear normal.  Nose: Nose normal.  Mouth/Throat: Oropharynx is clear and moist. No oropharyngeal exudate.  Eyes: Conjunctivae normal are normal.  Neck: Normal range of motion. Neck supple.  Cardiovascular: Normal rate, regular rhythm and normal heart sounds.   Pulmonary/Chest: Effort normal and breath sounds normal.  Lymphadenopathy:    She has no cervical adenopathy.  Neurological: She is alert and oriented to person, place, and time.  Skin: Skin is warm and dry.  Psychiatric: She has a normal mood and affect. Her behavior is normal.          Assessment & Plan:  Acute bronchitis- Discussed with pt 90 percent of bronchitis is viral. Encouraged pt to try Mucinex D twice a day. Gave handout for more symptomatic care. Printed zpak if not improving. Diflucan given to take after abx due to history of yeast infection. Call if not improving.

## 2012-12-24 NOTE — Patient Instructions (Addendum)
Mucinex D or just decongestants can try.     Bronchitis Bronchitis is the body's way of reacting to injury and/or infection (inflammation) of the bronchi. Bronchi are the air tubes that extend from the windpipe into the lungs. If the inflammation becomes severe, it may cause shortness of breath. CAUSES  Inflammation may be caused by:  A virus.  Germs (bacteria).  Dust.  Allergens.  Pollutants and many other irritants. The cells lining the bronchial tree are covered with tiny hairs (cilia). These constantly beat upward, away from the lungs, toward the mouth. This keeps the lungs free of pollutants. When these cells become too irritated and are unable to do their job, mucus begins to develop. This causes the characteristic cough of bronchitis. The cough clears the lungs when the cilia are unable to do their job. Without either of these protective mechanisms, the mucus would settle in the lungs. Then you would develop pneumonia. Smoking is a common cause of bronchitis and can contribute to pneumonia. Stopping this habit is the single most important thing you can do to help yourself. TREATMENT   Your caregiver may prescribe an antibiotic if the cough is caused by bacteria. Also, medicines that open up your airways make it easier to breathe. Your caregiver may also recommend or prescribe an expectorant. It will loosen the mucus to be coughed up. Only take over-the-counter or prescription medicines for pain, discomfort, or fever as directed by your caregiver.  Removing whatever causes the problem (smoking, for example) is critical to preventing the problem from getting worse.  Cough suppressants may be prescribed for relief of cough symptoms.  Inhaled medicines may be prescribed to help with symptoms now and to help prevent problems from returning.  For those with recurrent (chronic) bronchitis, there may be a need for steroid medicines. SEEK IMMEDIATE MEDICAL CARE IF:   During treatment,  you develop more pus-like mucus (purulent sputum).  You have a fever.  Your baby is older than 3 months with a rectal temperature of 102 F (38.9 C) or higher.  Your baby is 62 months old or younger with a rectal temperature of 100.4 F (38 C) or higher.  You become progressively more ill.  You have increased difficulty breathing, wheezing, or shortness of breath. It is necessary to seek immediate medical care if you are elderly or sick from any other disease. MAKE SURE YOU:   Understand these instructions.  Will watch your condition.  Will get help right away if you are not doing well or get worse. Document Released: 11/07/2005 Document Revised: 01/30/2012 Document Reviewed: 09/16/2008 [facility] Patient Information 2013 [place], [place].

## 2013-04-18 ENCOUNTER — Other Ambulatory Visit: Payer: Self-pay | Admitting: Physician Assistant

## 2013-04-22 ENCOUNTER — Ambulatory Visit (INDEPENDENT_AMBULATORY_CARE_PROVIDER_SITE_OTHER): Payer: BC Managed Care – PPO | Admitting: Family Medicine

## 2013-04-22 ENCOUNTER — Encounter: Payer: Self-pay | Admitting: Family Medicine

## 2013-04-22 VITALS — BP 114/71 | HR 78 | Temp 97.8°F | Wt 187.0 lb

## 2013-04-22 DIAGNOSIS — E785 Hyperlipidemia, unspecified: Secondary | ICD-10-CM

## 2013-04-22 DIAGNOSIS — B3731 Acute candidiasis of vulva and vagina: Secondary | ICD-10-CM

## 2013-04-22 DIAGNOSIS — Z131 Encounter for screening for diabetes mellitus: Secondary | ICD-10-CM

## 2013-04-22 DIAGNOSIS — N390 Urinary tract infection, site not specified: Secondary | ICD-10-CM

## 2013-04-22 DIAGNOSIS — B373 Candidiasis of vulva and vagina: Secondary | ICD-10-CM

## 2013-04-22 DIAGNOSIS — R300 Dysuria: Secondary | ICD-10-CM

## 2013-04-22 LAB — POCT URINALYSIS DIPSTICK
Glucose, UA: NEGATIVE
Ketones, UA: NEGATIVE
Protein, UA: NEGATIVE
Spec Grav, UA: 1.01

## 2013-04-22 MED ORDER — PHENAZOPYRIDINE HCL 200 MG PO TABS: 200.0000 mg | ORAL_TABLET | Freq: Three times a day (TID) | ORAL | Status: DC | PRN

## 2013-04-22 MED ORDER — FLUCONAZOLE 150 MG PO TABS
ORAL_TABLET | ORAL | Status: DC
Start: 2013-04-22 — End: 2015-02-23

## 2013-04-22 MED ORDER — CIPROFLOXACIN HCL 250 MG PO TABS: ORAL_TABLET | ORAL | Status: AC

## 2013-04-22 NOTE — Progress Notes (Signed)
CC: Katrina Mcclain is a 37 y.o. female is here for Dysuria   Subjective: HPI:  Patient complains of mild to moderate dysuria present on every voiding that started on Friday seems to be worsening daily basis. Slightly improved with Pyridium, nothing else makes better or worse. Accompanied by urinary frequency and hesitancy. Feels like prior urinary tract infections. Denies fevers, flank pain, vaginal discharge, vaginal bleeding, diarrhea, constipation, pelvic pain.  She believes it's been over a year since hyperlipidemia was assessed, she has been quite successful with diet and exercise interventions with weight loss.    It has been over a year since fasting blood sugar was checked.   Review Of Systems Outlined In HPI  Past Medical History  Diagnosis Date  . Depression   . Anxiety      Family History  Problem Relation Age of Onset  . Cancer Mother     Breast  . Ovarian cysts Sister      History  Substance Use Topics  . Smoking status: Never Smoker   . Smokeless tobacco: Never Used  . Alcohol Use: No     Objective: Filed Vitals:   04/22/13 1618  BP: 114/71  Pulse: 78  Temp: 97.8 F (36.6 C)    Vital signs reviewed. General: Alert and Oriented, No Acute Distress HEENT: Pupils equal, round, reactive to light. Conjunctivae clear.  External ears unremarkable.  Moist mucous membranes. Lungs: Clear and comfortable work of breathing, speaking in full sentences without accessory muscle use. Cardiac: Regular rate and rhythm.  Neuro: CN II-XII grossly intact, gait normal. Extremities: No peripheral edema.  Strong peripheral pulses.  Mental Status: No depression, anxiety, nor agitation. Logical though process. Skin: Warm and dry.  Assessment & Plan: Katrina Mcclain was seen today for dysuria.  Diagnoses and associated orders for this visit:  Dysuria - Urinalysis Dipstick - Urine Culture  UTI (urinary tract infection) - ciprofloxacin (CIPRO) 250 MG tablet; Take one by mouth twice  a day for five days. - phenazopyridine (PYRIDIUM) 200 MG tablet; Take 1 tablet (200 mg total) by mouth 3 (three) times daily as needed for pain. No more than 3 days.  Vaginal candida - fluconazole (DIFLUCAN) 150 MG tablet; Take one tab, may take second tab if no improvement after 72 hours.  Hyperlipidemia LDL goal < 160 - Lipid panel  Screening for diabetes mellitus - BASIC METABOLIC PANEL WITH GFR    Dysuria with urinalysis suspicious for urinary tract infection: Start Cipro, will follow culture, call me if no improvement by Wednesday. May use Pyridium as needed and Diflucan it history of post antibiotic vaginal candidiasis occurs Fasting labs placed for lipid panel and diabetic screening  Return if symptoms worsen or fail to improve.

## 2013-04-24 LAB — URINE CULTURE: Colony Count: 70000

## 2013-05-02 ENCOUNTER — Telehealth: Payer: Self-pay | Admitting: Family Medicine

## 2013-05-02 DIAGNOSIS — F32A Depression, unspecified: Secondary | ICD-10-CM

## 2013-05-02 DIAGNOSIS — F419 Anxiety disorder, unspecified: Secondary | ICD-10-CM

## 2013-05-02 DIAGNOSIS — F329 Major depressive disorder, single episode, unspecified: Secondary | ICD-10-CM

## 2013-05-02 DIAGNOSIS — N943 Premenstrual tension syndrome: Secondary | ICD-10-CM

## 2013-05-02 MED ORDER — CLONAZEPAM 1 MG PO TABS: 0.5000 mg | ORAL_TABLET | Freq: Two times a day (BID) | ORAL | Status: DC | PRN

## 2013-05-02 NOTE — Telephone Encounter (Signed)
Per patient request

## 2013-05-21 ENCOUNTER — Telehealth: Payer: Self-pay | Admitting: *Deleted

## 2013-05-21 NOTE — Telephone Encounter (Signed)
Okay for referral. Let me know if you have any problems getting in the order.

## 2013-05-21 NOTE — Telephone Encounter (Signed)
Pt called & would like another dermatology referral placed for a mole removal now that she has met her deductible.

## 2013-05-22 ENCOUNTER — Telehealth: Payer: Self-pay | Admitting: *Deleted

## 2013-05-22 DIAGNOSIS — D229 Melanocytic nevi, unspecified: Secondary | ICD-10-CM

## 2013-05-22 NOTE — Telephone Encounter (Signed)
Referral to derm ordered.

## 2013-08-12 ENCOUNTER — Encounter: Payer: Self-pay | Admitting: Family Medicine

## 2013-08-12 ENCOUNTER — Ambulatory Visit: Payer: BC Managed Care – PPO | Admitting: Physician Assistant

## 2013-08-12 ENCOUNTER — Ambulatory Visit (INDEPENDENT_AMBULATORY_CARE_PROVIDER_SITE_OTHER): Payer: BC Managed Care – PPO | Admitting: Family Medicine

## 2013-08-12 VITALS — BP 108/71 | HR 67 | Wt 190.0 lb

## 2013-08-12 DIAGNOSIS — F419 Anxiety disorder, unspecified: Secondary | ICD-10-CM

## 2013-08-12 DIAGNOSIS — L309 Dermatitis, unspecified: Secondary | ICD-10-CM

## 2013-08-12 DIAGNOSIS — L259 Unspecified contact dermatitis, unspecified cause: Secondary | ICD-10-CM

## 2013-08-12 DIAGNOSIS — F411 Generalized anxiety disorder: Secondary | ICD-10-CM

## 2013-08-12 MED ORDER — CLONAZEPAM 1 MG PO TABS: 0.5000 mg | ORAL_TABLET | Freq: Two times a day (BID) | ORAL | Status: DC | PRN

## 2013-08-12 MED ORDER — TRIAMCINOLONE ACETONIDE 0.1 % EX CREA: TOPICAL_CREAM | CUTANEOUS | Status: DC

## 2013-08-12 NOTE — Progress Notes (Signed)
CC: Katrina Mcclain is a 37 y.o. female is here for skin peeling on finger   Subjective: HPI:  Patient complains of skin irritation localized to the pad of the right fourth digit. It's been there for a week seems to be getting worse in a daily basis. It is mildly painful when pressure is applied. No interventions as of yet. 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 She denies swelling, redness, itching, bleeding or swollen joints.  Patient is asking for refill on Klonopin. She believes her anxiety is well controlled on this medication she is using it only during the evenings as needed to help with sleep she will take an occasional daytime dose if she is flying or  traveling with her husband driving. She denies depression, paranoia, mental disturbance, trouble staying asleep.    Review Of Systems Outlined In HPI  Past Medical History  Diagnosis Date  . Depression   . Anxiety      Family History  Problem Relation Age of Onset  . Cancer Mother     Breast  . Ovarian cysts Sister      History  Substance Use Topics  . Smoking status: Never Smoker   . Smokeless tobacco: Never Used  . Alcohol Use: No     Objective: Filed Vitals:   08/12/13 1539  BP: 108/71  Pulse: 67    General: Alert and Oriented, No Acute Distress HEENT: Pupils equal, round, reactive to light. Conjunctivae clear.   Moist mucous membranes pharynx unremarkable Lungs: Clear to auscultation bilaterally, no wheezing/ronchi/rales.  Comfortable work of breathing. Good air movement. Cardiac: Regular rate and rhythm. Normal S1/S2.  No murmurs, rubs, nor gallops.   Extremities: No peripheral edema.  Strong peripheral pulses. There is mild hyperkeratosis and fissuring at the pad of the right fourth digit without erythema, fluctuance, nor palpable subcutaneous abnormality Mental Status: No depression, anxiety, nor agitation. Skin: Warm and  dry.  Assessment & Plan: Katrina Mcclain was seen today for skin peeling on finger.  Diagnoses and associated orders for this visit:  Dermatitis - triamcinolone cream (KENALOG) 0.1 %; Apply to affected areas twice a day for up to two weeks, avoid face.  Anxiety disorder - clonazePAM (KLONOPIN) 1 MG tablet; Take 0.5 tablets (0.5 mg total) by mouth 2 (two) times daily as needed for anxiety (may increase to a whole tablet if needed).    Dermatitis: Suspect eczematous-type response from dry skin, start triamcinolone cream along with moisturizing agents if not improved in 2 weeks will refer to dermatology Anxiety: Well-controlled continue clonazepam  Return in about 3 months (around 11/11/2013).

## 2013-09-19 ENCOUNTER — Other Ambulatory Visit: Payer: Self-pay | Admitting: Family Medicine

## 2013-12-03 ENCOUNTER — Other Ambulatory Visit: Payer: Self-pay | Admitting: Physician Assistant

## 2013-12-04 ENCOUNTER — Ambulatory Visit (INDEPENDENT_AMBULATORY_CARE_PROVIDER_SITE_OTHER): Payer: BC Managed Care – PPO | Admitting: Physician Assistant

## 2013-12-04 ENCOUNTER — Encounter: Payer: Self-pay | Admitting: Physician Assistant

## 2013-12-04 VITALS — BP 118/70 | HR 76 | Wt 195.0 lb

## 2013-12-04 DIAGNOSIS — N943 Premenstrual tension syndrome: Secondary | ICD-10-CM

## 2013-12-04 DIAGNOSIS — F329 Major depressive disorder, single episode, unspecified: Secondary | ICD-10-CM

## 2013-12-04 DIAGNOSIS — F3289 Other specified depressive episodes: Secondary | ICD-10-CM

## 2013-12-04 DIAGNOSIS — G4700 Insomnia, unspecified: Secondary | ICD-10-CM | POA: Insufficient documentation

## 2013-12-04 DIAGNOSIS — F419 Anxiety disorder, unspecified: Secondary | ICD-10-CM

## 2013-12-04 DIAGNOSIS — F32A Depression, unspecified: Secondary | ICD-10-CM

## 2013-12-04 DIAGNOSIS — F411 Generalized anxiety disorder: Secondary | ICD-10-CM

## 2013-12-04 MED ORDER — CLONAZEPAM 1 MG PO TABS: 1.0000 mg | ORAL_TABLET | Freq: Two times a day (BID) | ORAL | Status: DC | PRN

## 2013-12-04 MED ORDER — BUPROPION HCL ER (XL) 150 MG PO TB24: 150.0000 mg | ORAL_TABLET | Freq: Every day | ORAL | Status: DC

## 2013-12-04 MED ORDER — TRAZODONE HCL 50 MG PO TABS: 50.0000 mg | ORAL_TABLET | Freq: Every day | ORAL | Status: DC

## 2013-12-04 NOTE — Progress Notes (Signed)
   Subjective:    Patient ID: Katrina Mcclain, female    DOB: September 25, 1976, 38 y.o.   MRN: [id number]  HPI Patient is a 38 year old female who presents to the clinic to followup on anxiety and depression. Patient has previously been on Klonopin as needed up to twice a day for anxiety. She has recently noticed her anxiety has increased. Her husband is: More with traveling for work. She has noticed a short temper with her kids. She's also had some depression feelings. She denies any suicidal or homicidal thoughts. She does feel like she needs something more for her anxiety and depression but did not like the way Celexa made her feel. Celexa nonperson but she didn't feel anything. Patient is also having problems sleeping. She wakes up multiple times at night and often has trouble one to sleep. When she wakes up she rarely feels rested. She has tried melatonin in the past and did not like the way it made her feel. She often takes Klonopin to help rest and helps some. She denies any regular snoring but has been known to snore in the past.   Review of Systems     Objective:   Physical Exam  Constitutional: She is oriented to person, place, and time. She appears well-developed and well-nourished.  HENT:  Head: Normocephalic and atraumatic.  Cardiovascular: Normal rate, regular rhythm and normal heart sounds.   Pulmonary/Chest: Effort normal and breath sounds normal.  Neurological: She is alert and oriented to person, place, and time.  Skin: Skin is warm and dry.  Psychiatric: She has a normal mood and affect. Her behavior is normal.          Assessment & Plan:  Anxiety/depression-GAD-7 was 13. PHQ-9 was 6. Discuss with patient starting Wellbutrin 150 mg extended-release once a day. Patient aware of side effects of worsening depression and GI issues. Patient also aware that it does take some time to get her system. Followup in 6-8 weeks. Klonopin was refilled to continue to use as needed. Fill it  Wellbutrin will give her some extra anxiety and depression relief. Encouraged regular exercise to give extra neurotransmitters.  Insomnia-at this point I do not feel like lack of sleeping is due to sleep apnea. We can consider sleep apnea testing in the near future. Since patient did not tolerate melatonin. Will prescribe trazodone 50 mg. Patient aware she can start at one half tablet and increase accordingly. Patient encouraged to create a good bedtime routine.  Spent 30 minutes with patient greater than 50% of the visit counseled patient regarding anxiety and depression treatment.

## 2013-12-04 NOTE — Patient Instructions (Signed)

## 2013-12-18 ENCOUNTER — Encounter: Payer: Self-pay | Admitting: Physician Assistant

## 2013-12-18 ENCOUNTER — Ambulatory Visit (INDEPENDENT_AMBULATORY_CARE_PROVIDER_SITE_OTHER): Payer: BC Managed Care – PPO | Admitting: Physician Assistant

## 2013-12-18 VITALS — BP 96/60 | HR 64 | Wt 193.0 lb

## 2013-12-18 DIAGNOSIS — L509 Urticaria, unspecified: Secondary | ICD-10-CM

## 2013-12-18 MED ORDER — METHYLPREDNISOLONE ACETATE 40 MG/ML IJ SUSP
40.0000 mg | Freq: Once | INTRAMUSCULAR | Status: AC
  Administered 2013-12-18: 40 mg via INTRAMUSCULAR

## 2013-12-18 MED ORDER — PREDNISONE 50 MG PO TABS: ORAL_TABLET | ORAL | Status: DC

## 2013-12-18 MED ORDER — HYDROXYZINE HCL 25 MG PO TABS: 25.0000 mg | ORAL_TABLET | Freq: Three times a day (TID) | ORAL | Status: DC | PRN

## 2013-12-18 NOTE — Progress Notes (Signed)
   Subjective:    Patient ID: Katrina Mcclain, female    DOB: 1976/07/12, 38 y.o.   MRN: [id number]  HPI Patient is a 38 year old female who presents to the clinic with her husband with a chief complaint of itchy rash and whelps on bilateral forearms, wrists, bilateral legs and buttocks. She noticed a rash and hives starting approximately 4 days ago. Rash does not seem to be getting any worse but is not getting any better. She's tried Benadryl and hydrocortisone cream with minimal relief. She denies any difficulty swallowing, shortness of breath, wheezing. She does have a slight right sided swelling and tingling feeling of the lip but she say she normally has a slightly bigger lip on her right side. She does not remember ingesting any fish origin there is. She has no known food allergies. On 12/04/2013 she was seen in the clinic and started on Wellbutrin and trazodone. She only took trazodone for couple of night because it made her too groggy in the morning and cause her to have headaches. She has continued on Wellbutrin and noticed that she seems to be feeling much better with mood. She is taking Wellbutrin every day. She has not used any new detergents or lotions. She does notice itching as little worse after a warm bath. Patient did have a baby shower on Sunday and eating a lot of foods that she does not usually eat and that have been prepared by other people.Patient is concerned with autoimmune diseases.    Review of Systems     Objective:   Physical Exam  Constitutional: She appears well-developed and well-nourished.  HENT:  Head: Normocephalic and atraumatic.  Mouth/Throat: Oropharynx is clear and moist.  Slightly swollen right side of lower lip.   Eyes: Conjunctivae are normal. Right eye exhibits no discharge. Left eye exhibits no discharge.  Neck: Normal range of motion. Neck supple.  Cardiovascular: Normal rate, regular rhythm and normal heart sounds.   Pulmonary/Chest: Effort normal and  breath sounds normal. She has no wheezes.  Lymphadenopathy:    She has no cervical adenopathy.  Skin:             Assessment & Plan:  Hives- possible medication reaction. I like for patient to stop Wellbutrin for the next 2 weeks. If hives are gone then restart. If reappear, stop wellbutrin, take vistaril, call office. Seek urgent healthcare if having any difficultly breathing, SOB, problems swallowing. Gave depo medrol 40mg  IM in office today. Prednisone burst for 5 days. Vistaril for itching. Oatmeal baths. Stay hydrated. Follow up as needed and if for anxiety as needed. Reassured pt that autoimmune diseases could be a possibility we need to rule out the most likely causes first and then progress. If symptoms not improving in the next 24-48 hours call office.

## 2013-12-18 NOTE — Patient Instructions (Addendum)
Do a 2 week wash out period of wellbutrin. After 2 weeks if no more hives then can try to add back. If come back stop wellbutrin and call office to add to allergic list. If any SOB/difficultly swallowing occures take vistaril/benadryl and seek help.  Vistaril up to three times a day.  Prednisone for 5 days.

## 2013-12-19 ENCOUNTER — Telehealth: Payer: Self-pay | Admitting: *Deleted

## 2013-12-19 NOTE — Telephone Encounter (Signed)
She is okay to continue the steroids, it does suppress his immune system which makes her so more susceptible to being sick. She seems to make sure she is following regular handwashing hygiene et Ronney Asters.

## 2013-12-19 NOTE — Telephone Encounter (Signed)
Pt.notified

## 2013-12-19 NOTE — Telephone Encounter (Signed)
Pt left a message stating that Luvenia Starch gave her 50mg  prednisone & she was reading on the rx info to be careful taking this if you've been around people with the flu. She states her son has the flu & strep right now & she's concerned if she is ok to continue to take this. Please advise

## 2013-12-22 LAB — CBC AND DIFFERENTIAL
HEMATOCRIT: 39 % (ref 36–46)
HEMOGLOBIN: 13.1 g/dL (ref 12.0–16.0)
NEUTROS ABS: 5 /uL
Platelets: 341 10*3/uL (ref 150–399)
WBC: 8.2 10^3/mL

## 2013-12-22 LAB — BASIC METABOLIC PANEL
BUN: 10 mg/dL (ref 4–21)
Creatinine: 0.5 mg/dL (ref 0.5–1.1)
GLUCOSE: 94 mg/dL
Potassium: 4 mmol/L (ref 3.4–5.3)
SODIUM: 139 mmol/L (ref 137–147)

## 2013-12-24 ENCOUNTER — Encounter: Payer: Self-pay | Admitting: *Deleted

## 2014-01-03 ENCOUNTER — Ambulatory Visit: Payer: BC Managed Care – PPO | Admitting: Physician Assistant

## 2014-01-06 ENCOUNTER — Ambulatory Visit (INDEPENDENT_AMBULATORY_CARE_PROVIDER_SITE_OTHER): Payer: BC Managed Care – PPO | Admitting: Physician Assistant

## 2014-01-06 ENCOUNTER — Ambulatory Visit: Payer: BC Managed Care – PPO | Admitting: Physician Assistant

## 2014-01-06 VITALS — BP 96/56 | HR 82 | Wt 194.0 lb

## 2014-01-06 DIAGNOSIS — F32A Depression, unspecified: Secondary | ICD-10-CM

## 2014-01-06 DIAGNOSIS — R1030 Lower abdominal pain, unspecified: Secondary | ICD-10-CM

## 2014-01-06 DIAGNOSIS — B3731 Acute candidiasis of vulva and vagina: Secondary | ICD-10-CM

## 2014-01-06 DIAGNOSIS — B373 Candidiasis of vulva and vagina: Secondary | ICD-10-CM

## 2014-01-06 DIAGNOSIS — F419 Anxiety disorder, unspecified: Secondary | ICD-10-CM

## 2014-01-06 DIAGNOSIS — F329 Major depressive disorder, single episode, unspecified: Secondary | ICD-10-CM

## 2014-01-06 DIAGNOSIS — F411 Generalized anxiety disorder: Secondary | ICD-10-CM

## 2014-01-06 DIAGNOSIS — F3289 Other specified depressive episodes: Secondary | ICD-10-CM

## 2014-01-06 MED ORDER — FLUCONAZOLE 150 MG PO TABS: 150.0000 mg | ORAL_TABLET | Freq: Once | ORAL | Status: DC

## 2014-01-06 NOTE — Patient Instructions (Signed)
Pristiq  Viibryd

## 2014-01-08 ENCOUNTER — Encounter: Payer: Self-pay | Admitting: Physician Assistant

## 2014-01-08 NOTE — Progress Notes (Signed)
   Subjective:    Patient ID: Katrina Mcclain, female    DOB: 1976/09/23, 38 y.o.   MRN: [id number]  HPI Pt is a 38 yo female who presents to the clinic to follow up after ED visit for abdominal pain on 12/22/2013. No etiology was found. CT, urine, CBC, BMP were all checked and negative. Pain resolved after that night and has not come back. Today she is symptom free. When she had pain it was severe and sharp in her left lower quadrant and radiated into back.   Pt also experience hives with Wellbutrin and has not been on any medication since for depression or anxiety. She does feel like she needs to be on something. Her friend told her about Pristiq and that she has done well on it and she had similar reaction with wellbutrin. She would like to try today.    Review of Systems     Objective:   Physical Exam  Constitutional: She is oriented to person, place, and time. She appears well-developed and well-nourished.  HENT:  Head: Normocephalic and atraumatic.  Cardiovascular: Normal rate, regular rhythm and normal heart sounds.   Pulmonary/Chest: Effort normal and breath sounds normal. She has no wheezes.  Neurological: She is alert and oriented to person, place, and time.  Skin: Skin is dry.  Psychiatric: She has a normal mood and affect. Her behavior is normal.          Assessment & Plan:  lower abdominal pain- resolved today. Could have been an ovarian cyst that ruptured before any imaging was done. Come in if pain comes back.   Depression/anxiety-Gave samples of pristiq to try before give rx want to make sure she doesn't start to have any reactions. If she does instructed to stop medication, take 25-50 benadryl and to call office. Call office if would like rx to be sent. Follow up in 4-6 weeks.   Vaginal candida- pt called back after appt and stated she really needed something for a yeast infection and forgot to mention it. Pt having typical symptoms of yeast infection itchy, white thick  discharge.  I did go ahead and send diflucan 150mg  once. If not resolving come back for appt.

## 2014-03-06 ENCOUNTER — Ambulatory Visit: Payer: BC Managed Care – PPO | Admitting: Family Medicine

## 2014-03-12 ENCOUNTER — Encounter: Payer: Self-pay | Admitting: Physician Assistant

## 2014-03-12 ENCOUNTER — Ambulatory Visit (INDEPENDENT_AMBULATORY_CARE_PROVIDER_SITE_OTHER): Payer: BC Managed Care – PPO | Admitting: Physician Assistant

## 2014-03-12 VITALS — BP 119/65 | HR 69 | Ht 66.5 in | Wt 191.0 lb

## 2014-03-12 DIAGNOSIS — Z0000 Encounter for general adult medical examination without abnormal findings: Secondary | ICD-10-CM

## 2014-03-12 DIAGNOSIS — R519 Headache, unspecified: Secondary | ICD-10-CM | POA: Insufficient documentation

## 2014-03-12 DIAGNOSIS — D2330 Other benign neoplasm of skin of unspecified part of face: Secondary | ICD-10-CM

## 2014-03-12 DIAGNOSIS — R51 Headache: Secondary | ICD-10-CM

## 2014-03-12 DIAGNOSIS — Z131 Encounter for screening for diabetes mellitus: Secondary | ICD-10-CM

## 2014-03-12 DIAGNOSIS — D2230 Melanocytic nevi of unspecified part of face: Secondary | ICD-10-CM

## 2014-03-12 DIAGNOSIS — B079 Viral wart, unspecified: Secondary | ICD-10-CM

## 2014-03-12 DIAGNOSIS — E7800 Pure hypercholesterolemia, unspecified: Secondary | ICD-10-CM

## 2014-03-12 DIAGNOSIS — R002 Palpitations: Secondary | ICD-10-CM

## 2014-03-12 LAB — LIPID PANEL
CHOL/HDL RATIO: 3.4 ratio
Cholesterol: 171 mg/dL (ref 0–200)
HDL: 51 mg/dL (ref >39)
LDL Cholesterol: 99 mg/dL (ref 0–99)
TRIGLYCERIDES: 107 mg/dL (ref <150)
VLDL: 21 mg/dL (ref 0–40)

## 2014-03-12 LAB — COMPLETE METABOLIC PANEL WITH GFR
ALK PHOS: 71 U/L (ref 39–117)
ALT: 17 U/L (ref 0–35)
AST: 14 U/L (ref 0–37)
Albumin: 4.3 g/dL (ref 3.5–5.2)
BILIRUBIN TOTAL: 0.7 mg/dL (ref 0.2–1.2)
BUN: 8 mg/dL (ref 6–23)
CO2: 25 mEq/L (ref 19–32)
CREATININE: 0.55 mg/dL (ref 0.50–1.10)
Calcium: 9.1 mg/dL (ref 8.4–10.5)
Chloride: 104 mEq/L (ref 96–112)
GLUCOSE: 78 mg/dL (ref 70–99)
Potassium: 4.4 mEq/L (ref 3.5–5.3)
SODIUM: 139 meq/L (ref 135–145)
TOTAL PROTEIN: 6.8 g/dL (ref 6.0–8.3)

## 2014-03-12 NOTE — Patient Instructions (Signed)
Premature Ventricular Contraction Premature ventricular contraction (PVC) is an irregularity of the heart rhythm involving extra or skipped heartbeats. In some cases, they may occur without obvious cause or heart disease. Other times, they can be caused by an electrolyte change in the blood. These need to be corrected. They can also be seen when there is not enough oxygen going to the heart. A common cause of this is plaque or cholesterol buildup. This buildup decreases the blood supply to the heart. In addition, extra beats may be caused or aggravated by:  Excessive smoking.  Alcohol consumption.  Caffeine.  Certain medications  Some street drugs. SYMPTOMS   The sensation of feeling your heart skipping a beat (palpitations).  In many cases, the person may have no symptoms. SIGNS AND TESTS   A physical examination may show an occasional irregularity, but if the PVC beats do not happen often, they may not be found on physical exam.  Blood pressure is usually normal.  Other tests that may find extra beats of the heart are:  An EKG (electrocardiogram)  A Holter monitor which can monitor your heart over longer periods of time  An Angiogram (study of the heart arteries). TREATMENT  Usually extra heartbeats do not need treatment. The condition is treated only if symptoms are severe or if extra beats are very frequent or are causing problems. An underlying cause, if discovered, may also require treatment.  Treatment may also be needed if there may be a risk for other more serious cardiac arrhythmias.  PREVENTION   Moderation in caffeine, alcohol, and tobacco use may reduce the risk of ectopic heartbeats in some people.  Exercise often helps people who lead a sedentary (inactive) lifestyle. PROGNOSIS  PVC heartbeats are generally harmless and do not need treatment.  RISKS AND COMPLICATIONS   Ventricular tachycardia (occasionally).  There usually are no complications.  Other  arrhythmias (occasionally). SEEK IMMEDIATE MEDICAL CARE IF:   You feel palpitations that are frequent or continual.  You develop chest pain or other problems such as shortness of breath, sweating, or nausea and vomiting.  You become light-headed or faint (pass out).  You get worse or do not improve with treatment. Document Released: 06/24/2004 Document Revised: 01/30/2012 Document Reviewed: 01/04/2008 [employer] Patient Information 2014 [employer].  

## 2014-03-12 NOTE — Progress Notes (Signed)
Subjective:     Katrina Mcclain is a 38 y.o. female and is here for a comprehensive physical exam. The patient reports mole that is dark on the left temple. no bleeding but has been told it needs to be removed.   Wart of the right ring finger. Noticed for past couple of months. Nothing tried to get rid of it. Appears to be getting larger. No pain or itching.   Heart fluttering at night- happening for last 3 months. Does not noticed during the day. She is very active with zumba and weight lifing. Admits to drinking a lot of caffiene. Denies any CP, arm pain, neck pain. Resolves on its on or with klonapin.   History   Social History  . Marital Status: Single    Spouse Name: N/A    Number of Children: N/A  . Years of Education: N/A   Occupational History  . Not on file.   Social History Main Topics  . Smoking status: Never Smoker   . Smokeless tobacco: Never Used  . Alcohol Use: No  . Drug Use: No  . Sexual Activity: Yes    Birth Control/ Protection: Surgical   Other Topics Concern  . Not on file   Social History Narrative  . No narrative on file   Health Maintenance  Topic Date Due  . Influenza Vaccine  06/21/2014  . Pap Smear  10/31/2014  . Tetanus/tdap  10/31/2021    The following portions of the patient's history were reviewed and updated as appropriate: allergies, current medications, past family history, past medical history, past social history, past surgical history and problem list.  Review of Systems A comprehensive review of systems was negative.   Objective:    BP 119/65  Pulse 69  Ht 5' 6.5" (1.689 m)  Wt 191 lb (86.637 kg)  BMI 30.37 kg/m2 General appearance: alert, cooperative and appears stated age Head: Normocephalic, without obvious abnormality, atraumatic Eyes: conjunctivae/corneas clear. PERRL, EOM's intact. Fundi benign. Ears: normal TM's and external ear canals both ears Nose: Nares normal. Septum midline. Mucosa normal. No drainage or sinus  tenderness. Throat: lips, mucosa, and tongue normal; teeth and gums normal Neck: no adenopathy, no carotid bruit, no JVD, supple, symmetrical, trachea midline and thyroid not enlarged, symmetric, no tenderness/mass/nodules Back: symmetric, no curvature. ROM normal. No CVA tenderness. Lungs: clear to auscultation bilaterally and normal percussion bilaterally Breasts: normal appearance, no masses or tenderness Heart: regular rate and rhythm, S1, S2 normal, no murmur, click, rub or gallop Abdomen: soft, non-tender; bowel sounds normal; no masses,  no organomegaly Extremities: extremities normal, atraumatic, no cyanosis or edema Pulses: 2+ and symmetric Skin: Skin color, texture, turgor normal. No rashes or lesions wart on right ring finger lateral nail fold. Pigmented nevus of left temple with regular borders approimately69mm by 37mm. Lymph nodes: Cervical, supraclavicular, and axillary nodes normal. Neurologic: Grossly normal    Assessment:    Healthy female exam.      Plan:    CPE- pap up to date. Not yet ready for screening mammograms. Will get fasting labs. Discussed calcium 1200mg  and vitamin D800IU. BMI over 30 discussed and encouraged weight loss to improve bmi and overall health.   Heart fluttering- EKG NSR, no ST elevation or depression, no arrhymias. No qwave. Discussed with pt likely PVC's. Gave ho and discussed there are usually triggers. caffiene could be her trigger. Consider diet changes and seeing if palpations occur. If continues or worsening can consider stress test.   Headache- seems  to be after exercise and complains of neck tension. Discussed symptomatic cold compresses, hydration to see if headaches improve. If worsening please come back for full work up.   Wart- Cryotherapy Procedure Note  Pre-operative Diagnosis: wart  Post-operative Diagnosis: wart  Locations: right ring finger around the nail fold  Indications: irritation  Anesthesia: none  Procedure Details   History of allergy to iodine: no. Pacemaker? no.  Patient informed of risks (permanent scarring, infection, light or dark discoloration, bleeding, infection, weakness, numbness and recurrence of the lesion) and benefits of the procedure and verbal informed consent obtained.  The areas are treated with liquid nitrogen therapy, frozen until ice ball extended 1-2 mm beyond lesion, allowed to thaw, and treated again. The patient tolerated procedure well.  The patient was instructed on post-op care, warned that there may be blister formation, redness and pain. Recommend OTC analgesia as needed for pain.  Condition: Stable  Complications: none.  Plan: 1. Instructed to keep the area dry and covered for 24-48h and clean thereafter. 2. Warning signs of infection were reviewed.   3. Recommended that the patient use OTC analgesics as needed for pain.   See After Visit Summary for Counseling Recommendations

## 2014-03-14 ENCOUNTER — Encounter: Payer: Self-pay | Admitting: *Deleted

## 2014-04-11 ENCOUNTER — Encounter: Payer: Self-pay | Admitting: Physician Assistant

## 2014-04-11 DIAGNOSIS — D2230 Melanocytic nevi of unspecified part of face: Secondary | ICD-10-CM | POA: Insufficient documentation

## 2014-07-01 ENCOUNTER — Ambulatory Visit: Payer: BC Managed Care – PPO | Admitting: Physician Assistant

## 2014-07-02 ENCOUNTER — Ambulatory Visit (INDEPENDENT_AMBULATORY_CARE_PROVIDER_SITE_OTHER): Payer: BC Managed Care – PPO | Admitting: Physician Assistant

## 2014-07-02 ENCOUNTER — Encounter: Payer: Self-pay | Admitting: Physician Assistant

## 2014-07-02 VITALS — BP 108/65 | HR 73 | Temp 97.9°F | Ht 66.0 in | Wt 192.0 lb

## 2014-07-02 DIAGNOSIS — B079 Viral wart, unspecified: Secondary | ICD-10-CM

## 2014-07-02 DIAGNOSIS — F3289 Other specified depressive episodes: Secondary | ICD-10-CM

## 2014-07-02 DIAGNOSIS — F329 Major depressive disorder, single episode, unspecified: Secondary | ICD-10-CM

## 2014-07-02 DIAGNOSIS — F411 Generalized anxiety disorder: Secondary | ICD-10-CM

## 2014-07-02 DIAGNOSIS — F32A Depression, unspecified: Secondary | ICD-10-CM

## 2014-07-02 MED ORDER — SALICYLIC ACID 26 % EX LIQD: CUTANEOUS | Status: DC

## 2014-07-02 MED ORDER — CLONAZEPAM 1 MG PO TABS: 1.0000 mg | ORAL_TABLET | Freq: Two times a day (BID) | ORAL | Status: DC | PRN

## 2014-07-02 NOTE — Progress Notes (Signed)
   Subjective:    Patient ID: Katrina Mcclain, female    DOB: [birth year], 38 y.o.   MRN: [id number]  HPI Pt presents to the clinic for follow up.   Wart was frozen off 03/12/14 it went away but started to come back about 2 months later. She would like frozen again. No complications with cryotherapy.   Depression/anxiety- pt never tried pristiq. She is unsure if ADD could be playing a role as well she would like referral to [facility]. She continues to feel anxious and easily irritated. She does need refill on clonzepam as needed. Denies any suicidal or homicidal thoughts.    Review of Systems  All other systems reviewed and are negative.      Objective:   Physical Exam  Constitutional: She appears well-developed and well-nourished.  HENT:  Head: Normocephalic and atraumatic.  Cardiovascular: Normal rate, regular rhythm and normal heart sounds.   Pulmonary/Chest: Effort normal and breath sounds normal. She has no wheezes.  Psychiatric: She has a normal mood and affect. Her behavior is normal.          Assessment & Plan:  Wart- cryotherapy done today again. Given rx for salicylic acid to use if wart comes back. Gave HO. Encouraged duct tape.   Cryotherapy Procedure Note  Pre-operative Diagnosis: wart   Post-operative Diagnosis: wart  Locations: right ring finger along the nail fold  Indications: irritation  Anesthesia: none  Procedure Details  History of allergy to iodine: no. Pacemaker? no.  Patient informed of risks (permanent scarring, infection, light or dark discoloration, bleeding, infection, weakness, numbness and recurrence of the lesion) and benefits of the procedure and verbal informed consent obtained.  The areas are treated with liquid nitrogen therapy, frozen until ice ball extended 2 mm beyond lesion, allowed to thaw, and treated again. The patient tolerated procedure well.  The patient was instructed on post-op care, warned that there may be blister formation,  redness and pain. Recommend OTC analgesia as needed for pain.  Condition: Stable  Complications: none.  Plan: 1. Instructed to keep the area dry and covered for 24-48h and clean thereafter. 2. Warning signs of infection were reviewed.   3. Recommended that the patient use OTC acetaminophen as needed for pain.     Anxiety/depression- made referral to [facility]. Refill clonzepam as needed.

## 2014-07-02 NOTE — Patient Instructions (Signed)
Warts Warts are a common viral infection. They are most commonly caused by the human papillomavirus (HPV). Warts can occur at all ages. However, they occur most frequently in older children and infrequently in the elderly. Warts may be single or multiple. Location and size varies. Warts can be spread by scratching the wart and then scratching normal skin. The life cycle of warts varies. However, most will disappear over many months to a couple years. Warts commonly do not cause problems (asymptomatic) unless they are over an area of pressure, such as the bottom of the foot. If they are large enough, they may cause pain with walking. DIAGNOSIS  Warts are most commonly diagnosed by their appearance. Tissue samples (biopsies) are not required unless the wart looks abnormal. Most warts have a rough surface, are round, oval, or irregular, and are skin-colored to light yellow, brown, or gray. They are generally less than  inch (1.3 cm), but they can be any size. TREATMENT   Observation or no treatment.  Freezing with liquid nitrogen.  High heat (cautery).  Boosting the body's immunity to fight off the wart (immunotherapy using Candida antigen).  Laser surgery.  Application of various irritants and solutions. HOME CARE INSTRUCTIONS  Follow your caregiver's instructions. No special precautions are necessary. Often, treatment may be followed by a return (recurrence) of warts. Warts are generally difficult to treat and get rid of. If treatment is done in a clinic setting, usually more than 1 treatment is required. This is usually done on only a monthly basis until the wart is completely gone. SEEK IMMEDIATE MEDICAL CARE IF: The treated skin becomes red, puffy (swollen), or painful. Document Released: 08/17/2005 Document Revised: 03/04/2013 Document Reviewed: 02/12/2010 [employer] Patient Information 2015 [employer]. This information is not intended to replace advice given to you by your health care  provider. Make sure you discuss any questions you have with your health care provider.  

## 2014-07-22 ENCOUNTER — Encounter (HOSPITAL_COMMUNITY): Payer: Self-pay | Admitting: Psychiatry

## 2014-07-22 ENCOUNTER — Ambulatory Visit (INDEPENDENT_AMBULATORY_CARE_PROVIDER_SITE_OTHER): Payer: BC Managed Care – PPO | Admitting: Psychiatry

## 2014-07-22 ENCOUNTER — Telehealth (HOSPITAL_COMMUNITY): Payer: Self-pay

## 2014-07-22 VITALS — BP 94/68 | HR 78 | Ht 66.5 in | Wt 193.0 lb

## 2014-07-22 DIAGNOSIS — F329 Major depressive disorder, single episode, unspecified: Secondary | ICD-10-CM

## 2014-07-22 DIAGNOSIS — F411 Generalized anxiety disorder: Secondary | ICD-10-CM

## 2014-07-22 DIAGNOSIS — F339 Major depressive disorder, recurrent, unspecified: Secondary | ICD-10-CM

## 2014-07-22 DIAGNOSIS — F3289 Other specified depressive episodes: Secondary | ICD-10-CM

## 2014-07-22 MED ORDER — BUPROPION HCL 75 MG PO TABS: 75.0000 mg | ORAL_TABLET | Freq: Three times a day (TID) | ORAL | Status: DC
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
## 2014-07-22 NOTE — Telephone Encounter (Signed)
Called pharmacy. wellbutrin should be 75mg  once a day. Total 30 per month.

## 2014-07-22 NOTE — Patient Instructions (Signed)
Last dose of wellbutrin was xr 150mg  comes up as hives in allergy. We will start low dose of regular wellbutrin 75mg . Please keep an eye on any allergic reaction and call us. Do not increase dose.

## 2014-07-31 ENCOUNTER — Telehealth (HOSPITAL_COMMUNITY): Payer: Self-pay

## 2014-07-31 NOTE — Telephone Encounter (Signed)
Pt called to let you know she is doing well. She is not having any issues.

## 2014-08-06 ENCOUNTER — Ambulatory Visit (INDEPENDENT_AMBULATORY_CARE_PROVIDER_SITE_OTHER): Payer: BC Managed Care – PPO

## 2014-08-06 ENCOUNTER — Ambulatory Visit (INDEPENDENT_AMBULATORY_CARE_PROVIDER_SITE_OTHER): Payer: BC Managed Care – PPO | Admitting: Physician Assistant

## 2014-08-06 ENCOUNTER — Encounter: Payer: Self-pay | Admitting: Physician Assistant

## 2014-08-06 VITALS — BP 113/66 | HR 71 | Temp 98.0°F | Ht 66.0 in | Wt 193.0 lb

## 2014-08-06 DIAGNOSIS — J069 Acute upper respiratory infection, unspecified: Secondary | ICD-10-CM | POA: Diagnosis not present

## 2014-08-06 DIAGNOSIS — J029 Acute pharyngitis, unspecified: Secondary | ICD-10-CM | POA: Diagnosis not present

## 2014-08-06 DIAGNOSIS — R0789 Other chest pain: Secondary | ICD-10-CM

## 2014-08-06 LAB — POCT RAPID STREP A (OFFICE): RAPID STREP A SCREEN: NEGATIVE

## 2014-08-06 MED ORDER — HYDROCODONE-HOMATROPINE 5-1.5 MG/5ML PO SYRP: 5.0000 mL | ORAL_SOLUTION | Freq: Every evening | ORAL | Status: DC | PRN

## 2014-08-06 MED ORDER — AMOXICILLIN-POT CLAVULANATE 875-125 MG PO TABS: 1.0000 | ORAL_TABLET | Freq: Two times a day (BID) | ORAL | Status: DC

## 2014-08-06 NOTE — Patient Instructions (Addendum)
flonase 2 sprays each nostril.  Chlor-trimeton at bedtime.   Upper Respiratory Infection, Adult An upper respiratory infection (URI) is also known as the common cold. It is often caused by a type of germ (virus). Colds are easily spread (contagious). You can pass it to others by kissing, coughing, sneezing, or drinking out of the same glass. Usually, you get better in 1 or 2 weeks.  HOME CARE   Only take medicine as told by your doctor.  Use a warm mist humidifier or breathe in steam from a hot shower.  Drink enough water and fluids to keep your pee (urine) clear or pale yellow.  Get plenty of rest.  Return to work when your temperature is back to normal or as told by your doctor. You may use a face mask and wash your hands to stop your cold from spreading. GET HELP RIGHT AWAY IF:   After the first few days, you feel you are getting worse.  You have questions about your medicine.  You have chills, shortness of breath, or brown or red spit (mucus).  You have yellow or brown snot (nasal discharge) or pain in the face, especially when you bend forward.  You have a fever, puffy (swollen) neck, pain when you swallow, or white spots in the back of your throat.  You have a bad headache, ear pain, sinus pain, or chest pain.  You have a high-pitched whistling sound when you breathe in and out (wheezing).  You have a lasting cough or cough up blood.  You have sore muscles or a stiff neck. MAKE SURE YOU:   Understand these instructions.  Will watch your condition.  Will get help right away if you are not doing well or get worse. Document Released: 04/25/2008 Document Revised: 01/30/2012 Document Reviewed: 02/12/2014 [facility] Patient Information 2015 [place], [place]. This information is not intended to replace advice given to you by your health care provider. Make sure you discuss any questions you have with your health care provider.

## 2014-08-06 NOTE — Progress Notes (Signed)
   Subjective:    Patient ID: Katrina Mcclain, female    DOB: 05/14/1976, 38 y.o.   MRN: [id number]  HPI Pt presents to the clinic with one week of cough, ST, nasal congestion and drainage. She is developing some chest tightness as well. No SOB or wheezing. She has tried essential oils, mucinex, and anti-histamine. She feels on and off like she is getting better then getting worse. No fever, chills, n/v/d. No sinus pressure or ear pain.     Review of Systems  All other systems reviewed and are negative.      Objective:   Physical Exam  Constitutional: She is oriented to person, place, and time. She appears well-developed and well-nourished.  HENT:  Head: Normocephalic and atraumatic.  TM's clear with some air bubbles behind TM.  Oropharynx erythematous with no tonsilar swelling or exudate. PND present. Bilateral nasal turbinates red and swollen.  Negative for any maxillary tenderness.   Eyes: Conjunctivae are normal. Right eye exhibits no discharge. Left eye exhibits no discharge.  Neck: Normal range of motion. Neck supple.  Cardiovascular: Normal rate, regular rhythm and normal heart sounds.   Pulmonary/Chest: Effort normal and breath sounds normal. She has no wheezes.  Lymphadenopathy:    She has no cervical adenopathy.  Neurological: She is alert and oriented to person, place, and time.  Skin: Skin is dry.  Psychiatric: She has a normal mood and affect. Her behavior is normal.          Assessment & Plan:  URI- no signs of bacterial infection. Rapid strep negative.will get CXR. Try OTC mucinex D daily, chlor-trimeton at bedtime and flonase. If not improving given augmentin to start for 10 days. Hycodan for cough at bedtime. Delsym for during the day.

## 2014-08-19 ENCOUNTER — Ambulatory Visit (HOSPITAL_COMMUNITY): Payer: Self-pay | Admitting: Psychiatry

## 2014-09-02 ENCOUNTER — Encounter (HOSPITAL_COMMUNITY): Payer: Self-pay | Admitting: Psychiatry

## 2014-09-02 ENCOUNTER — Ambulatory Visit (INDEPENDENT_AMBULATORY_CARE_PROVIDER_SITE_OTHER): Payer: BC Managed Care – PPO | Admitting: Psychiatry

## 2014-09-02 VITALS — BP 110/79 | HR 74 | Wt 193.0 lb

## 2014-09-02 DIAGNOSIS — F411 Generalized anxiety disorder: Secondary | ICD-10-CM

## 2014-09-02 DIAGNOSIS — F331 Major depressive disorder, recurrent, moderate: Secondary | ICD-10-CM

## 2014-09-02 DIAGNOSIS — F329 Major depressive disorder, single episode, unspecified: Secondary | ICD-10-CM

## 2014-09-02 DIAGNOSIS — F900 Attention-deficit hyperactivity disorder, predominantly inattentive type: Secondary | ICD-10-CM

## 2014-09-02 MED ORDER — BUPROPION HCL 75 MG PO TABS: ORAL_TABLET | ORAL | Status: DC

## 2014-09-02 NOTE — Progress Notes (Signed)
Patient ID: Katrina Mcclain, female   DOB: December 30, 1975, 38 y.o.   MRN: [id number]  [facility] Follow up Visit  Katrina Mcclain [id number] 38 y.o.  09/02/2014 10:57 AM  Chief Complaint:  Overwhelm and stress.  History of Present Illness:  Katrina Mcclain is a caucasian married female. 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 Patient Presents for Initial Evaluation with symptoms of feeling overwhelmed. History of  ADHD. Referred by primary care physician. Apparently she's been having more mood swings getting easily overwhelmed, stressful. Decreased sleep and decreased energy. Despite decrease energy she still keeps herself busy when she gets started into projects she cannot finish them she keeps in cleaning or taking care of things till she gets overwhelmed. She is to keep track of her activities will write it down otherwise she forgets. She gets easily distracted and forgetful. Says that she was treated for ADHD in the past. For 5 years ago also she said that she is treated for ADHD and Wellbutrin did help but she started having hives. Apparently the dose of Wellbutrin was 150 mg but she has not started on the lower dose.  Does not endorse any psychotic symptoms delusions hallucinations. No crying spells or suicidal or homicidal thoughts.  Last visit we re started wellbutrin at lower dose of 75mg . It has helped. She feels less distracted and overwhelmed. Still has some bad days but mood swings and depression are more in control.Not overly anxious. 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   Aggravating factors. Gets overwhelmed at home's. She has 2 twin daughters age 55 she also has 3 other kids. Her 63-year-old son has been diagnosed with ADHD and at times his behavior is difficult to manage. Dealing with the kids gets frustrated and she loses patience.  Modifying factors; exercise eating healthy relationships also been on healthier not stressful with her  husband.  Depression scale; 7/10. Endorses mood swings and feeling overwhelmed.  Timing: when around with kids or if have poor sleep. Context: Taking care of home    No manic symptoms or psychotic symptoms in the past. She does not have episodes of increased the elevated mood that will last for days and days. She describes herself to be hyperthymic or having a personal isn't she has more energy at times but not to the point of doing risky things or regret for things.    Past Psychiatric History/Hospitalization(s) One time overnight 22 years ago OD on pills when she was having difficult time with her x husband. Her stomach was pumped, says it was impulsive and she was not kept in the  hospital for long. Has been on different medications including Celexa, Paxil. Says that these medications have not helped much. She has been on Wellbutrin that that helped but the cost high so she stopped it there is no allergic breathing reactions to Wellbutrin  Medical History; Past Medical History  Diagnosis Date  . Depression   . Anxiety   . Headache     Allergies: Allergies  Allergen Reactions  . Wellbutrin [Bupropion] Hives    Medications: Outpatient Encounter Prescriptions as of 09/02/2014  Medication Sig  . amoxicillin-clavulanate (AUGMENTIN) 875-125 MG per tablet Take 1 tablet by mouth 2 (two) times daily.  Marland Kitchen buPROPion (WELLBUTRIN) 75 MG tablet Take one a day.  . clonazePAM (KLONOPIN) 1 MG tablet Take 1 tablet (1 mg total) by mouth 2 (two) times daily as needed for anxiety.  Marland Kitchen HYDROcodone-homatropine (HYCODAN) 5-1.5  MG/5ML syrup Take 5 mLs by mouth at bedtime as needed.  . Magnesium Citrate 100 MG TABS Take 1 tablet by mouth.  . Salicylic Acid 26 % LIQD Apply daily to affected area.  . [DISCONTINUED] buPROPion (WELLBUTRIN) 75 MG tablet Take 1 tablet (75 mg total) by mouth 3 (three) times daily.    Family History; Family History  Problem Relation Age of Onset  . Cancer Mother      Breast  . Ovarian cysts Sister   . OCD Father     Labs:  Recent Results (from the past 2160 hour(s))  POCT RAPID STREP A (OFFICE)     Status: None   Collection Time    08/06/14 11:10 AM      Result Value Ref Range   Rapid Strep A Screen Negative  Negative       Musculoskeletal: Strength & Muscle Tone: within normal limits Gait & Station: normal Patient leans: N/A  Mental Status Examination;   Psychiatric Specialty Exam: Physical Exam  Constitutional: She appears well-developed and well-nourished.    Review of Systems  Constitutional: Negative.   Neurological: Negative for dizziness, tremors and headaches.  Psychiatric/Behavioral: Negative for depression, suicidal ideas and substance abuse. The patient is not nervous/anxious.     Blood pressure 110/79, pulse 74, weight 193 lb (87.544 kg).Body mass index is 31.17 kg/(m^2).  General Appearance: Casual  Eye Contact::  Fair  Speech:  Normal Rate  Volume:  Normal  Mood:  Dysphoric and describes to have mood swings  Affect:  Congruent  Thought Process:  Circumstantial  Orientation:  Full (Time, Place, and Person)  Thought Content:  Rumination  Suicidal Thoughts:  No  Homicidal Thoughts:  No  Memory:  Immediate;   Fair Recent;   Fair  Judgement:  Fair  Insight:  Fair  Psychomotor Activity:  Normal  Concentration:  Fair  Recall:  Fair  Akathisia:  Negative  Handed:  Right  AIMS (if indicated):     Assets:  Communication Skills Desire for Improvement Intimacy Physical Health Resilience  Sleep:        Assessment: Axis I: Depressive disorder unspecified. Possible Maj. depressive disorder recurrent moderate. Rule out ADHD. Denies anxiety disorder  Axis II: Deferred  Axis III:  Past Medical History  Diagnosis Date  . Depression   . Anxiety   . Headache     Axis IV: Psychosocial   Treatment Plan and Summary: Continue wellbutrin 75mg  small dose to avoid hives which happened in past.   Pertinent Labs  and Relevant Prior Notes reviewed. Medication Side effects, benefits and risks reviewed/discussed with Patient. Time given for patient to respond and asks questions regarding the Diagnosis and Medications. Safety concerns and to report to ER if suicidal or call 911. Relevant Medications refilled or called in to pharmacy. Discussed weight maintenance and Sleep Hygiene. Follow up with Primary care provider in regards to Medical conditions. Recommend compliance with medications and follow up office appointments. Discussed to avail opportunity to consider or/and continue Individual therapy with Counselor. Greater than 50% of time was spend in counseling and coordination of care with the patient.  Schedule for Follow up visit in  4 weeks  or call in earlier as necessary.   Merian Capron, MD 09/02/2014

## 2014-10-28 ENCOUNTER — Ambulatory Visit (HOSPITAL_COMMUNITY): Payer: Self-pay | Admitting: Psychiatry

## 2014-12-02 ENCOUNTER — Encounter (HOSPITAL_COMMUNITY): Payer: Self-pay | Admitting: Psychiatry

## 2014-12-02 ENCOUNTER — Encounter: Payer: Self-pay | Admitting: Physician Assistant

## 2014-12-02 ENCOUNTER — Ambulatory Visit (INDEPENDENT_AMBULATORY_CARE_PROVIDER_SITE_OTHER): Payer: BLUE CROSS/BLUE SHIELD | Admitting: Physician Assistant

## 2014-12-02 ENCOUNTER — Ambulatory Visit (INDEPENDENT_AMBULATORY_CARE_PROVIDER_SITE_OTHER): Payer: BLUE CROSS/BLUE SHIELD | Admitting: Psychiatry

## 2014-12-02 ENCOUNTER — Encounter (INDEPENDENT_AMBULATORY_CARE_PROVIDER_SITE_OTHER): Payer: Self-pay

## 2014-12-02 VITALS — BP 98/57 | HR 69 | Ht 66.0 in | Wt 197.0 lb

## 2014-12-02 VITALS — BP 121/72 | HR 71 | Ht 66.0 in | Wt 199.0 lb

## 2014-12-02 DIAGNOSIS — F900 Attention-deficit hyperactivity disorder, predominantly inattentive type: Secondary | ICD-10-CM

## 2014-12-02 DIAGNOSIS — F411 Generalized anxiety disorder: Secondary | ICD-10-CM

## 2014-12-02 DIAGNOSIS — N644 Mastodynia: Secondary | ICD-10-CM | POA: Diagnosis not present

## 2014-12-02 DIAGNOSIS — Z803 Family history of malignant neoplasm of breast: Secondary | ICD-10-CM | POA: Insufficient documentation

## 2014-12-02 DIAGNOSIS — F329 Major depressive disorder, single episode, unspecified: Secondary | ICD-10-CM

## 2014-12-02 DIAGNOSIS — F331 Major depressive disorder, recurrent, moderate: Secondary | ICD-10-CM

## 2014-12-02 LAB — CBC WITH DIFFERENTIAL/PLATELET
BASOS PCT: 0 % (ref 0–1)
Basophils Absolute: 0 10*3/uL (ref 0.0–0.1)
EOS ABS: 0.2 10*3/uL (ref 0.0–0.7)
EOS PCT: 2 % (ref 0–5)
HEMATOCRIT: 39.3 % (ref 36.0–46.0)
Hemoglobin: 13.1 g/dL (ref 12.0–15.0)
Lymphocytes Relative: 34 % (ref 12–46)
Lymphs Abs: 2.7 10*3/uL (ref 0.7–4.0)
MCH: 30.8 pg (ref 26.0–34.0)
MCHC: 33.3 g/dL (ref 30.0–36.0)
MCV: 92.5 fL (ref 78.0–100.0)
MONOS PCT: 5 % (ref 3–12)
MPV: 9.3 fL (ref 8.6–12.4)
Monocytes Absolute: 0.4 10*3/uL (ref 0.1–1.0)
NEUTROS PCT: 59 % (ref 43–77)
Neutro Abs: 4.6 10*3/uL (ref 1.7–7.7)
Platelets: 334 10*3/uL (ref 150–400)
RBC: 4.25 MIL/uL (ref 3.87–5.11)
RDW: 12.8 % (ref 11.5–15.5)
WBC: 7.8 10*3/uL (ref 4.0–10.5)

## 2014-12-02 MED ORDER — BUPROPION HCL 100 MG PO TABS: ORAL_TABLET | ORAL | Status: DC

## 2014-12-02 NOTE — Progress Notes (Signed)
Patient ID: Katrina Mcclain, female   DOB: December 21, 1975, 39 y.o.   MRN: [id number]  [facility] Follow up Outpatient Visit  Katrina Mcclain [id number] 39 y.o.  12/02/2014 10:52 AM  Chief Complaint:  Overwhelm and stress.  History of Present Illness:  Katrina Mcclain is a caucasian married female. 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 Patient initially presented for "Evaluation with symptoms of feeling overwhelmed. History of  ADHD. Referred by primary care physician. Apparently she's been having more mood swings getting easily overwhelmed, stressful. Decreased sleep and decreased energy. Despite decrease energy she still keeps herself busy when she gets started into projects she cannot finish them she keeps in cleaning or taking care of things till she gets overwhelmed. She is to keep track of her activities will write it down otherwise she forgets. She gets easily distracted and forgetful. Says that she was treated for ADHD in the past. For 5 years ago also she said that she is treated for ADHD and Wellbutrin did help but she started having hives. Apparently the dose of Wellbutrin was 150 mg but she has not started on the lower dose."  We have kept her Wellbutrin 75 mg she has not developed high spray she is doing feeling better but she feels that medications may need to be increased. She is able to focus better and divide her work and do multitasking but at times she feels overwhelmed. There is no reported side effects  Does not endorse any psychotic symptoms delusions hallucinations. No crying spells or suicidal or homicidal thoughts.  . Aggravating factors. Gets overwhelmed at home's. She has 2 twin daughters age 39 she also has 3 other kids. Her 10-year-old son has been diagnosed with ADHD and at times his behavior is difficult to manage. Dealing with the kids gets frustrated and she loses patience.  Modifying factors; exercise eating healthy relationships also been  on healthier not stressful with her husband.  Depression scale; 7/10. 10 being no depression.  Endorses mood swings and feeling overwhelmed.  Timing: when around with kids or if have poor sleep. Context: Taking care of home    No manic symptoms or psychotic symptoms in the past. She does not have episodes of increased the elevated mood that will last for days and days. She describes herself to be hyperthymic or having a personal isn't she has more energy at times but not to the point of doing risky things or regret for things.    Past Psychiatric History/Hospitalization(s) One time overnight 22 years ago OD on pills when she was having difficult time with her x husband. Her stomach was pumped, says it was impulsive and she was not kept in the  hospital for long. Has been on different medications including Celexa, Paxil. Says that these medications have not helped much. She has been on Wellbutrin that that helped but the cost high so she stopped it there is no allergic breathing reactions to Wellbutrin  Medical History; Past Medical History  Diagnosis Date  . Depression   . Anxiety   . Headache     Allergies: Allergies  Allergen Reactions  . Wellbutrin [Bupropion] Hives    Medications: Outpatient Encounter Prescriptions as of 12/02/2014  Medication Sig  . amoxicillin-clavulanate (AUGMENTIN) 875-125 MG per tablet Take 1 tablet by mouth 2 (two) times daily.  Marland Kitchen buPROPion (WELLBUTRIN) 100 MG tablet Take one a day.  . clonazePAM (KLONOPIN) 1 MG tablet Take 1 tablet (1 mg  total) by mouth 2 (two) times daily as needed for anxiety.  . Magnesium Citrate 100 MG TABS Take 1 tablet by mouth.  . Salicylic Acid 26 % LIQD Apply daily to affected area.  . [DISCONTINUED] buPROPion (WELLBUTRIN) 75 MG tablet Take one a day.  . [DISCONTINUED] HYDROcodone-homatropine (HYCODAN) 5-1.5 MG/5ML syrup Take 5 mLs by mouth at bedtime as needed. (Patient not taking: Reported on 12/02/2014)    Family  History; Family History  Problem Relation Age of Onset  . Cancer Mother     Breast  . Ovarian cysts Sister   . OCD Father     Labs:  No results found for this or any previous visit (from the past 2160 hour(s)).     Musculoskeletal: Strength & Muscle Tone: within normal limits Gait & Station: normal Patient leans: N/A  Mental Status Examination;   Psychiatric Specialty Exam: Physical Exam  Constitutional: She appears well-developed and well-nourished.    Review of Systems  Constitutional: Negative for fever.  Cardiovascular: Negative for chest pain.  Skin: Negative for rash.  Neurological: Negative for dizziness, tremors and headaches.  Psychiatric/Behavioral: Negative for depression, suicidal ideas and substance abuse. The patient is not nervous/anxious.     Blood pressure 121/72, pulse 71, height 5\' 6"  (1.676 m), weight 199 lb (90.266 kg).Body mass index is 32.13 kg/(m^2).  General Appearance: Casual  Eye Contact::  Fair  Speech:  Normal Rate  Volume:  Normal  Mood:  Dysphoric and describes to have mood swings  Affect:  Congruent  Thought Process:  Circumstantial  Orientation:  Full (Time, Place, and Person)  Thought Content:  Rumination  Suicidal Thoughts:  No  Homicidal Thoughts:  No  Memory:  Immediate;   Fair Recent;   Fair  Judgement:  Fair  Insight:  Fair  Psychomotor Activity:  Normal  Concentration:  Fair  Recall:  Fair  Akathisia:  Negative  Handed:  Right  AIMS (if indicated):     Assets:  Communication Skills Desire for Improvement Intimacy Physical Health Resilience  Sleep:        Assessment: Axis I: Depressive disorder unspecified. Possible Maj. depressive disorder recurrent moderate. Rule out ADHD. Denies anxiety disorder  Axis II: Deferred  Axis III:  Past Medical History  Diagnosis Date  . Depression   . Anxiety   . Headache     Axis IV: Psychosocial   Treatment Plan and Summary: Increased Wellbutrin to 100 mg.  Discussed her side effects. In case she is having hives she needs to stop it or cut it down she understands. At a higher dose in the past she has had hives but is not sure it may have been not related to Wellbutrin  Pertinent Labs and Relevant Prior Notes reviewed. Medication Side effects, benefits and risks reviewed/discussed with Patient. Time given for patient to respond and asks questions regarding the Diagnosis and Medications. Safety concerns and to report to ER if suicidal or call 911. Relevant Medications refilled or called in to pharmacy. Discussed weight maintenance and Sleep Hygiene. Follow up with Primary care provider in regards to Medical conditions. Recommend compliance with medications and follow up office appointments. Discussed to avail opportunity to consider or/and continue Individual therapy with Counselor. Greater than 50% of time was spend in counseling and coordination of care with the patient.  Schedule for Follow up visit in  4 weeks  or call in earlier as necessary.   Merian Capron, MD 12/02/2014

## 2014-12-02 NOTE — Patient Instructions (Signed)
Will order breast ultrasound.

## 2014-12-02 NOTE — Progress Notes (Signed)
   Subjective:    Patient ID: Katrina Mcclain, female    DOB: 1976/08/28, 39 y.o.   MRN: [id number]  HPI  Patient is a 39 year old female who presents to the clinic with right breast pain for the last 2 weeks. She has not necessarily noticed worsening but has been constant. She does not know when her last menstrual cycle was because she had an ablation and she just spots near or around cycle. She did have a massage last week in her massage therapist noticed that her lymph nodes on the right side were more congested. She denies any nipple discharge. She denies any nipple retraction. She denies any noticeable lumps or bumps. She has not had any weight loss, night sweats, fever or chills. She has not noticed any redness of the breast. She is concerned because her mother had inflammatory breast cancer.     Review of Systems  All other systems reviewed and are negative.      Objective:   Physical Exam  Constitutional: She is oriented to person, place, and time. She appears well-developed and well-nourished.  HENT:  Head: Normocephalic and atraumatic.  Pulmonary/Chest:    Neurological: She is alert and oriented to person, place, and time.  Skin: Skin is dry.  Psychiatric: She has a normal mood and affect. Her behavior is normal.          Assessment & Plan:  Right breast pain- no lump or masses palpated, no redness. No other lymphadenopathy. Will get CBC and ESR. Mother has history of inflammatory breast cancer. Mild tenderness over right breast. Breast are very fibrodense. Will schedule ultrasound of breast.

## 2014-12-03 LAB — SEDIMENTATION RATE: Sed Rate: 9 mm/hr (ref 0–22)

## 2014-12-09 ENCOUNTER — Other Ambulatory Visit: Payer: Self-pay | Admitting: Physician Assistant

## 2014-12-09 DIAGNOSIS — N644 Mastodynia: Secondary | ICD-10-CM

## 2014-12-09 DIAGNOSIS — Z803 Family history of malignant neoplasm of breast: Secondary | ICD-10-CM

## 2014-12-17 ENCOUNTER — Other Ambulatory Visit: Payer: Self-pay

## 2014-12-24 ENCOUNTER — Other Ambulatory Visit: Payer: Self-pay

## 2014-12-29 ENCOUNTER — Other Ambulatory Visit: Payer: Self-pay

## 2015-02-03 ENCOUNTER — Encounter (HOSPITAL_COMMUNITY): Payer: Self-pay | Admitting: Psychiatry

## 2015-02-03 ENCOUNTER — Encounter (INDEPENDENT_AMBULATORY_CARE_PROVIDER_SITE_OTHER): Payer: Self-pay

## 2015-02-03 ENCOUNTER — Ambulatory Visit (INDEPENDENT_AMBULATORY_CARE_PROVIDER_SITE_OTHER): Payer: BLUE CROSS/BLUE SHIELD | Admitting: Physician Assistant

## 2015-02-03 ENCOUNTER — Ambulatory Visit (INDEPENDENT_AMBULATORY_CARE_PROVIDER_SITE_OTHER): Payer: BLUE CROSS/BLUE SHIELD | Admitting: Psychiatry

## 2015-02-03 ENCOUNTER — Ambulatory Visit (INDEPENDENT_AMBULATORY_CARE_PROVIDER_SITE_OTHER): Payer: BLUE CROSS/BLUE SHIELD

## 2015-02-03 ENCOUNTER — Encounter: Payer: Self-pay | Admitting: Physician Assistant

## 2015-02-03 VITALS — BP 116/68 | HR 80 | Ht 66.0 in | Wt 203.0 lb

## 2015-02-03 DIAGNOSIS — M79671 Pain in right foot: Secondary | ICD-10-CM | POA: Diagnosis not present

## 2015-02-03 DIAGNOSIS — F331 Major depressive disorder, recurrent, moderate: Secondary | ICD-10-CM

## 2015-02-03 DIAGNOSIS — F411 Generalized anxiety disorder: Secondary | ICD-10-CM

## 2015-02-03 DIAGNOSIS — F900 Attention-deficit hyperactivity disorder, predominantly inattentive type: Secondary | ICD-10-CM

## 2015-02-03 MED ORDER — CLONAZEPAM 1 MG PO TABS: 1.0000 mg | ORAL_TABLET | Freq: Every day | ORAL | Status: DC | PRN

## 2015-02-03 NOTE — Progress Notes (Signed)
Patient ID: Katrina Mcclain, female   DOB: [birth year], 39 y.o.   MRN: [id number]  [facility] Follow up Outpatient Visit  Katrina Mcclain [id number] 38 y.o.  02/03/2015 10:53 AM  Chief Complaint:  Overwhelm and stress.  History of Present Illness:  Katrina Mcclain is a caucasian married female. 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 Patient initially presented for "Evaluation with symptoms of feeling overwhelmed. History of  ADHD. Referred by primary care physician. Apparently she's been having more mood swings getting easily overwhelmed, stressful. Decreased sleep and decreased energy. Despite decrease energy she still keeps herself busy when she gets started into projects she cannot finish them she keeps in cleaning or taking care of things till she gets overwhelmed. She is to keep track of her activities will write it down otherwise she forgets. She gets easily distracted and forgetful. Says that she was treated for ADHD in the past. For 5 years ago also she said that she is treated for ADHD and Wellbutrin did help but she started having hives. Apparently the dose of Wellbutrin was 150 mg but she has not started on the lower dose."  Last visit increased Wellbutrin to 100 mg for inattention up at least she started having anxiety and palpitations at night so she has discontinued it. She still is able to function to multitasking but with some concentration difficulties at nighttime she starts having panic-like symptoms or anxiety since Klonopin has been helpful in the past but overall she is not feeling hopeless or depressed she does endorse generalized worries  Does not endorse any psychotic symptoms delusions hallucinations. No crying spells or suicidal or homicidal thoughts.  . Aggravating factors. Gets overwhelmed at home's. She has 2 twin daughters age 62 she also has 3 other kids. Her 95-year-old son has been diagnosed with ADHD and at times his behavior is difficult  to manage. Dealing with the kids gets frustrated and she loses patience.  Modifying factors; exercise eating healthy relationships also been on healthier not stressful with her husband.  Depression scale; 7/10. 10 being no depression.  Endorses mood swings and feeling overwhelmed.  Timing: when around with kids or if have poor sleep. Context: Taking care of home     Past Psychiatric History/Hospitalization(s) One time overnight 22 years ago OD on pills when she was having difficult time with her x husband. Her stomach was pumped, says it was impulsive and she was not kept in the  hospital for long. Has been on different medications including Celexa, Paxil. Says that these medications have not helped much. She has been on Wellbutrin that that helped but the cost high so she stopped it there is no allergic breathing reactions to Wellbutrin  Medical History; Past Medical History  Diagnosis Date  . Depression   . Anxiety   . Headache     Allergies: Allergies  Allergen Reactions  . Wellbutrin [Bupropion] Hives    Medications: Outpatient Encounter Prescriptions as of 02/03/2015  Medication Sig  . clonazePAM (KLONOPIN) 1 MG tablet Take 1 tablet (1 mg total) by mouth daily as needed for anxiety.  . Magnesium Citrate 100 MG TABS Take 1 tablet by mouth.  . [DISCONTINUED] clonazePAM (KLONOPIN) 1 MG tablet Take 1 tablet (1 mg total) by mouth 2 (two) times daily as needed for anxiety.  . [DISCONTINUED] buPROPion (WELLBUTRIN) 100 MG tablet Take one a day.    Family History; Family History  Problem Relation Age of Onset  .  Cancer Mother     Breast  . Ovarian cysts Sister   . OCD Father     Labs:  Recent Results (from the past 2160 hour(s))  CBC w/Diff     Status: None   Collection Time: 12/02/14 11:43 AM  Result Value Ref Range   WBC 7.8 4.0 - 10.5 K/uL   RBC 4.25 3.87 - 5.11 MIL/uL   Hemoglobin 13.1 12.0 - 15.0 g/dL   HCT 39.3 36.0 - 46.0 %   MCV 92.5 78.0 - 100.0 fL   MCH  30.8 26.0 - 34.0 pg   MCHC 33.3 30.0 - 36.0 g/dL   RDW 12.8 11.5 - 15.5 %   Platelets 334 150 - 400 K/uL   MPV 9.3 8.6 - 12.4 fL    Comment: ** Please note change in reference range(s). **   Neutrophils Relative % 59 43 - 77 %   Neutro Abs 4.6 1.7 - 7.7 K/uL   Lymphocytes Relative 34 12 - 46 %   Lymphs Abs 2.7 0.7 - 4.0 K/uL   Monocytes Relative 5 3 - 12 %   Monocytes Absolute 0.4 0.1 - 1.0 K/uL   Eosinophils Relative 2 0 - 5 %   Eosinophils Absolute 0.2 0.0 - 0.7 K/uL   Basophils Relative 0 0 - 1 %   Basophils Absolute 0.0 0.0 - 0.1 K/uL   Smear Review Criteria for review not met   Sed Rate (ESR)     Status: None   Collection Time: 12/02/14 11:43 AM  Result Value Ref Range   Sed Rate 9 0 - 22 mm/hr       Musculoskeletal: Strength & Muscle Tone: within normal limits Gait & Station: normal Patient leans: N/A  Mental Status Examination;   Psychiatric Specialty Exam: Physical Exam  Constitutional: She appears well-developed and well-nourished.    Review of Systems  Constitutional: Negative.   Skin: Negative for rash.  Psychiatric/Behavioral: Negative for suicidal ideas and substance abuse. The patient is nervous/anxious.     Blood pressure 116/68, pulse 80, height 5' 6"  (1.676 m), weight 203 lb (92.08 kg).Body mass index is 32.78 kg/(m^2).  General Appearance: Casual  Eye Contact::  Fair  Speech:  Normal Rate  Volume:  Normal  Mood:  Somewhat anxious  Affect:  Congruent  Thought Process:  Circumstantial  Orientation:  Full (Time, Place, and Person)  Thought Content:  Rumination  Suicidal Thoughts:  No  Homicidal Thoughts:  No  Memory:  Immediate;   Fair Recent;   Fair  Judgement:  Fair  Insight:  Fair  Psychomotor Activity:  Normal  Concentration:  Fair  Recall:  Fair  Akathisia:  Negative  Handed:  Right  AIMS (if indicated):     Assets:  Communication Skills Desire for Improvement Intimacy Physical Health Resilience  Sleep:         Assessment: Axis I: Depressive disorder unspecified. Generalized anxiety disorder. Rule out ADHD.   Axis II: Deferred  Axis III:  Past Medical History  Diagnosis Date  . Depression   . Anxiety   . Headache     Axis IV: Psychosocial   Treatment Plan and Summary: C's other SSRIs have not helped stimulant medications may cause more anxiety. We will continue Klonopin on low-dose of 1 mg or half a tablet at night for her anxiety and panic symptoms. She does not want to be on any other medications announces that she is able to function during the daytime but does endorse racing thoughts  with anxiety at night and Klonopin does help if she takes it on a when necessary basis. Pertinent Labs and Relevant Prior Notes reviewed. Medication Side effects, benefits and risks reviewed/discussed with Patient. Time given for patient to respond and asks questions regarding the Diagnosis and Medications. Safety concerns and to report to ER if suicidal or call 911. Relevant Medications refilled or called in to pharmacy. Discussed weight maintenance and Sleep Hygiene. Follow up with Primary care provider in regards to Medical conditions. Recommend compliance with medications and follow up office appointments. Discussed to avail opportunity to consider or/and continue Individual therapy with Counselor. Greater than 50% of time was spend in counseling and coordination of care with the patient.  Schedule for Follow up visit in  8 weeks  or call in earlier as necessary.   Merian Capron, MD 02/03/2015

## 2015-02-03 NOTE — Progress Notes (Signed)
   Subjective:    Patient ID: Katrina Mcclain, female    DOB: 06-02-1976, 39 y.o.   MRN: [id number]  HPI  Pt presents to the clinic with 1 month of right anterior foot pain. She denies any known injury. She does run and do zumba regularly. She does not feel the pain as much when she does zumba but more when she runs and especially when she goes up hills. Not really tried anything to make better. Pain is not contant but is sharp to a dull pain when occurs.     Review of Systems  All other systems reviewed and are negative.      Objective:   Physical Exam  Constitutional: She appears well-developed and well-nourished.  HENT:  Head: Normocephalic and atraumatic.  Musculoskeletal:  Full ROM of right foot.  No bruising or swelling noted.  Strength 5/5.  Pain to palpation over metatarsal around 3rd and 4th of right foot.  Some discomfort with plantar flexion over anterior right foot.  No pain over plantar fascia or right heel.   Psychiatric: She has a normal mood and affect. Her behavior is normal.          Assessment & Plan:  Right foot pain- xray was negative. Symptoms sound consisent with stress fracture vs tendonitis over extensor tendons of foot. Put in post op boot for 2  weeks with no high impact exercise. Pt make bike or swim. After 2 weeks slowly add in more high impact exercisespennsaid samples given to use bid for anterior foot. Follow up in 4 weeks.

## 2015-02-16 ENCOUNTER — Ambulatory Visit (INDEPENDENT_AMBULATORY_CARE_PROVIDER_SITE_OTHER): Payer: BLUE CROSS/BLUE SHIELD | Admitting: Physician Assistant

## 2015-02-16 ENCOUNTER — Encounter: Payer: Self-pay | Admitting: Physician Assistant

## 2015-02-16 VITALS — BP 116/75 | HR 93 | Temp 98.8°F | Ht 66.0 in | Wt 205.0 lb

## 2015-02-16 DIAGNOSIS — M79672 Pain in left foot: Secondary | ICD-10-CM | POA: Diagnosis not present

## 2015-02-16 DIAGNOSIS — J029 Acute pharyngitis, unspecified: Secondary | ICD-10-CM | POA: Diagnosis not present

## 2015-02-16 DIAGNOSIS — J039 Acute tonsillitis, unspecified: Secondary | ICD-10-CM

## 2015-02-16 LAB — POCT RAPID STREP A (OFFICE): Rapid Strep A Screen: NEGATIVE

## 2015-02-16 MED ORDER — PREDNISONE 50 MG PO TABS: 50.0000 mg | ORAL_TABLET | Freq: Every day | ORAL | Status: DC

## 2015-02-16 MED ORDER — AMOXICILLIN-POT CLAVULANATE 875-125 MG PO TABS: 1.0000 | ORAL_TABLET | Freq: Two times a day (BID) | ORAL | Status: DC

## 2015-02-16 MED ORDER — DICLOFENAC SODIUM 2 % TD SOLN: 1.0000 "application " | Freq: Two times a day (BID) | TRANSDERMAL | Status: DC

## 2015-02-16 NOTE — Patient Instructions (Signed)
Ibuprofen 800mg  three times a day.  Tylenol 1000mg  every three times a day.   Strep Throat Strep throat is an infection of the throat caused by a bacteria named Streptococcus pyogenes. Your health care provider may call the infection streptococcal "tonsillitis" or "pharyngitis" depending on whether there are signs of inflammation in the tonsils or back of the throat. Strep throat is most common in children aged 40-15 years during the cold months of the year, but it can occur in people of any age during any season. This infection is spread from person to person (contagious) through coughing, sneezing, or other close contact. SIGNS AND SYMPTOMS   Fever or chills.  Painful, swollen, red tonsils or throat.  Pain or difficulty when swallowing.  White or yellow spots on the tonsils or throat.  Swollen, tender lymph nodes or "glands" of the neck or under the jaw.  Red rash all over the body (rare). DIAGNOSIS  Many different infections can cause the same symptoms. A test must be done to confirm the diagnosis so the right treatment can be given. A "rapid strep test" can help your health care provider make the diagnosis in a few minutes. If this test is not available, a light swab of the infected area can be used for a throat culture test. If a throat culture test is done, results are usually available in a day or two. TREATMENT  Strep throat is treated with antibiotic medicine. HOME CARE INSTRUCTIONS   Gargle with 1 tsp of salt in 1 cup of warm water, 3-4 times per day or as needed for comfort.  Family members who also have a sore throat or fever should be tested for strep throat and treated with antibiotics if they have the strep infection.  Make sure everyone in your household washes their hands well.  Do not share food, drinking cups, or personal items that could cause the infection to spread to others.  You may need to eat a soft food diet until your sore throat gets better.  Drink enough  water and fluids to keep your urine clear or pale yellow. This will help prevent dehydration.  Get plenty of rest.  Stay home from school, day care, or work until you have been on antibiotics for 24 hours.  Take medicines only as directed by your health care provider.  Take your antibiotic medicine as directed by your health care provider. Finish it even if you start to feel better. SEEK MEDICAL CARE IF:   The glands in your neck continue to enlarge.  You develop a rash, cough, or earache.  You cough up green, yellow-brown, or bloody sputum.  You have pain or discomfort not controlled by medicines.  Your problems seem to be getting worse rather than better.  You have a fever. SEEK IMMEDIATE MEDICAL CARE IF:   You develop any new symptoms such as vomiting, severe headache, stiff or painful neck, chest pain, shortness of breath, or trouble swallowing.  You develop severe throat pain, drooling, or changes in your voice.  You develop swelling of the neck, or the skin on the neck becomes red and tender.  You develop signs of dehydration, such as fatigue, dry mouth, and decreased urination.  You become increasingly sleepy, or you cannot wake up completely. MAKE SURE YOU:  Understand these instructions.  Will watch your condition.  Will get help right away if you are not doing well or get worse. Document Released: 11/04/2000 Document Revised: 03/24/2014 Document Reviewed: 01/06/2011 [employer] Patient  Information 2015 [employer]. This information is not intended to replace advice given to you by your health care provider. Make sure you discuss any questions you have with your health care provider.  

## 2015-02-16 NOTE — Progress Notes (Signed)
   Subjective:    Patient ID: Katrina Mcclain, female    DOB: [birth year], 39 y.o.   MRN: [id number]  HPI Pt presents to the clinic with ST, difficultly swallowing, swollen and tender lymph nodes for last 24 hours very bad symptoms. Started with a mild sore throat 2 days ago. Other people in house sick but not with same symptoms. No fever, chills, cough, SOB, sinus pressure. Some bilateral ear pain. Taking ibuprofen with little relief.    Review of Systems  All other systems reviewed and are negative.      Objective:   Physical Exam  Constitutional: She is oriented to person, place, and time. She appears well-developed and well-nourished.  HENT:  Head: Normocephalic and atraumatic.  Right Ear: External ear normal.  Left Ear: External ear normal.  Nose: Nose normal.  Bilateral enlarged tonsil with white exudate.  Uvula midline.    Eyes: Conjunctivae are normal. Right eye exhibits no discharge. Left eye exhibits no discharge.  Neck: Normal range of motion. Neck supple.  Very tendner and swollen bilateral anterior cervical adenopathy  Cardiovascular: Normal rate, regular rhythm and normal heart sounds.   Pulmonary/Chest: Effort normal and breath sounds normal.  Neurological: She is alert and oriented to person, place, and time.  Skin: Skin is dry.  Psychiatric: She has a normal mood and affect. Her behavior is normal.          Assessment & Plan:  Acute tonsilitis/acute pharyngitis- symptoms seem consistent with strep throat. Rapid strep was negative. Treated with augmentin for 10 days and prednisone for 5 days. Follow up if symptoms worsening or not improving. No signs of peri-tonsiliar abscess. Gargle with salt water. Alternate tylenol and ibpurofen for pain.   Left foot pain- doing well on pennsaid sent to pharmacy.

## 2015-02-23 ENCOUNTER — Other Ambulatory Visit: Payer: Self-pay | Admitting: *Deleted

## 2015-02-23 DIAGNOSIS — B373 Candidiasis of vulva and vagina: Secondary | ICD-10-CM

## 2015-02-23 DIAGNOSIS — B3731 Acute candidiasis of vulva and vagina: Secondary | ICD-10-CM

## 2015-02-23 MED ORDER — FLUCONAZOLE 150 MG PO TABS: ORAL_TABLET | ORAL | Status: DC

## 2015-02-26 ENCOUNTER — Emergency Department
Admission: EM | Admit: 2015-02-26 | Discharge: 2015-02-26 | Disposition: A | Discharge status: Home / Self Care | Payer: BLUE CROSS/BLUE SHIELD | Source: Home / Self Care | Attending: Emergency Medicine | Admitting: Emergency Medicine

## 2015-02-26 ENCOUNTER — Encounter: Payer: Self-pay | Admitting: *Deleted

## 2015-02-26 DIAGNOSIS — R059 Cough, unspecified: Secondary | ICD-10-CM

## 2015-02-26 DIAGNOSIS — R5383 Other fatigue: Secondary | ICD-10-CM

## 2015-02-26 DIAGNOSIS — R05 Cough: Secondary | ICD-10-CM

## 2015-02-26 DIAGNOSIS — J209 Acute bronchitis, unspecified: Secondary | ICD-10-CM | POA: Diagnosis not present

## 2015-02-26 LAB — POCT MONO SCREEN (KUC)

## 2015-02-26 MED ORDER — AZITHROMYCIN 250 MG PO TABS: ORAL_TABLET | ORAL | Status: DC

## 2015-02-26 MED ORDER — PREDNISONE 20 MG PO TABS: 20.0000 mg | ORAL_TABLET | Freq: Two times a day (BID) | ORAL | Status: DC

## 2015-02-26 NOTE — ED Notes (Signed)
Pt c/o cough, productive at times, chest soreness, congestion and body aches x 4 days. She was treated for strep last week.

## 2015-02-26 NOTE — ED Provider Notes (Signed)
CSN: [id number]     Arrival date & time 02/26/15  1148 History   First MD Initiated Contact with Patient 02/26/15 1210     Chief Complaint  Patient presents with  . Cough  . Generalized Body Aches   (Consider location/radiation/quality/duration/timing/severity/associated sxs/prior Treatment) HPI Pt c/o cough, productive at times, chest soreness, congestion and body aches x 4 days. She was treated for strep last week.   Remainder of Review of Systems negative for acute change except as noted in the HPI.  Past Medical History  Diagnosis Date  . Depression   . Anxiety   . Headache    Past Surgical History  Procedure Laterality Date  . Cesarean section    . Cholecystectomy    . Tubal ligation     Family History  Problem Relation Age of Onset  . Cancer Mother     Breast  . Ovarian cysts Sister   . OCD Father    History  Substance Use Topics  . Smoking status: Never Smoker   . Smokeless tobacco: Never Used  . Alcohol Use: No   OB History    No data available     Review of Systems  Allergies  Wellbutrin  Home Medications   Prior to Admission medications   Medication Sig Start Date End Date Taking? Authorizing Provider  azithromycin (ZITHROMAX Z-PAK) 250 MG tablet Take 2 tablets on day one, then 1 tablet daily on days 2 through 5 02/26/15   Jacqulyn Cane, MD  clonazePAM (KLONOPIN) 1 MG tablet Take 1 tablet (1 mg total) by mouth daily as needed for anxiety. 02/03/15   Merian Capron, MD  Diclofenac Sodium (PENNSAID) 2 % SOLN Place 1 application onto the skin 2 (two) times daily. 02/16/15   Jade L Breeback, PA-C  fluconazole (DIFLUCAN) 150 MG tablet Take one tab, may take second tab if no improvement after 72 hours. 02/23/15 03/01/15  Donella Stade, PA-C  Magnesium Citrate 100 MG TABS Take 1 tablet by mouth.    Historical Provider, MD  predniSONE (DELTASONE) 20 MG tablet Take 1 tablet (20 mg total) by mouth 2 (two) times daily with a meal. X 7 days 02/26/15   Jacqulyn Cane, MD    BP 115/78 mmHg  Pulse 89  Temp(Src) 98.1 F (36.7 C) (Oral)  Resp 16  Wt 201 lb (91.173 kg)  SpO2 98% Physical Exam  Constitutional: She is oriented to person, place, and time. She appears well-developed and well-nourished. No distress.  HENT:  Head: Normocephalic and atraumatic.  Right Ear: Tympanic membrane normal.  Left Ear: Tympanic membrane normal.  Nose: Nose normal.  Mouth/Throat: Oropharynx is clear and moist. No oropharyngeal exudate.  Eyes: Right eye exhibits no discharge. Left eye exhibits no discharge. No scleral icterus.  Neck: Neck supple.  Cardiovascular: Normal rate, regular rhythm and normal heart sounds.   Pulmonary/Chest: No respiratory distress. She has wheezes. She has rhonchi. She has no rales.  Lymphadenopathy:    She has no cervical adenopathy.  Neurological: She is alert and oriented to person, place, and time.  Skin: Skin is warm and dry.  Nursing note and vitals reviewed.   ED Course  Procedures (including critical care time) Labs Review Labs Reviewed  POCT MONO SCREEN [facility])   Results for orders placed or performed during the hospital encounter of 02/26/15  POCT mono screen  Result Value Ref Range   Mono, POC  Negative     Imaging Review No results found.   MDM  1. Acute bronchitis with bronchospasm   2. Cough   3. Other fatigue      Reviewed with her that she had exudative tonsillitis about 10 days ago, resolved on Augmentin. Because of some persistent fatigue and small shotty anterior cervical lymph nodes, she requested Mono spot test which we did today and was negative. Clinically has acute bronchitis with mild bronchospasm. Treatment options discussed, as well as risks, benefits, alternatives. Patient voiced understanding and agreement with the following plans: New Prescriptions   AZITHROMYCIN (ZITHROMAX Z-PAK) 250 MG TABLET    Take 2 tablets on day one, then 1 tablet daily on days 2 through 5   PREDNISONE (DELTASONE) 20 MG  TABLET    Take 1 tablet (20 mg total) by mouth 2 (two) times daily with a meal. X 7 days   Other symptomatic care discussed Follow-up with your primary care doctor in 5-7 days if not improving, or sooner if symptoms become worse. Precautions discussed. Red flags discussed. Questions invited and answered. Patient voiced understanding and agreement.   Jacqulyn Cane, MD 02/27/15 [phone]

## 2015-03-03 ENCOUNTER — Ambulatory Visit: Payer: Self-pay | Admitting: Physician Assistant

## 2015-04-01 ENCOUNTER — Encounter: Payer: Self-pay | Admitting: Family Medicine

## 2015-04-01 ENCOUNTER — Ambulatory Visit (INDEPENDENT_AMBULATORY_CARE_PROVIDER_SITE_OTHER): Payer: BLUE CROSS/BLUE SHIELD | Admitting: Family Medicine

## 2015-04-01 VITALS — BP 98/69 | HR 76 | Wt 200.0 lb

## 2015-04-01 DIAGNOSIS — M79671 Pain in right foot: Secondary | ICD-10-CM

## 2015-04-01 NOTE — Progress Notes (Signed)
CC: Katrina Mcclain is a 39 y.o. female is here for rt foot pain   Subjective: HPI:   continued right foot pain that has been present now for approximate 7 weeks. Treatment has included relative rest and wearing a postop shoe for the past 4 weeks. After 2 weeks symptoms started to improve so she started to use it less frequently. She is back to using it most days of the week. Advised on the dorsal surface of the right foot above the fourth metatarsal mostly middle to distal aspect of the shaft. Improves with well fitting footwear. Worse with bearing weight. Nothing else seems to make better or worse. There's been some swelling but no other overlying skin changes such as redness warmth or bruising.overall symptoms do not seem to have improved or worsened. Symptoms are currently mild to moderate in severity. Denies joint pain elsewhere, fevers, chills, nor injury.   Review Of Systems Outlined In HPI  Past Medical History  Diagnosis Date  . Depression   . Anxiety   . Headache     Past Surgical History  Procedure Laterality Date  . Cesarean section    . Cholecystectomy    . Tubal ligation     Family History  Problem Relation Age of Onset  . Cancer Mother     Breast  . Ovarian cysts Sister   . OCD Father     History   Social History  . Marital Status: Married    Spouse Name: N/A  . Number of Children: N/A  . Years of Education: N/A   Occupational History  . Not on file.   Social History Main Topics  . Smoking status: Never Smoker   . Smokeless tobacco: Never Used  . Alcohol Use: No  . Drug Use: No  . Sexual Activity: Yes    Birth Control/ Protection: Surgical   Other Topics Concern  . Not on file   Social History Narrative     Objective: BP 98/69 mmHg  Pulse 76  Wt 200 lb (90.719 kg)  Vital signs reviewed. General: Alert and Oriented, No Acute Distress HEENT: Pupils equal, round, reactive to light. Conjunctivae clear.  External ears unremarkable.  Moist mucous  membranes. Lungs: Clear and comfortable work of breathing, speaking in full sentences without accessory muscle use. Cardiac: Regular rate and rhythm.  Neuro: CN II-XII grossly intact, gait normal. Extremities: No peripheral edema.  Strong peripheral pulses. Pain is reproduced with palpation of the dorsal surface of the right foot when pressure is applied at the middle to distal fourth metatarsal and between this space and the fifth metatarsal. No pain with compression of the toe box, no pain at the base of the fifth metatarsal, no pain with resisted inversion or eversion of the ankle. No overlying skin changes at the site of pain Mental Status: No depression, anxiety, nor agitation. Logical though process. Skin: Warm and dry.   Assessment & Plan: Katrina Mcclain was seen today for rt foot pain.  Diagnoses and all orders for this visit:  Right foot pain   Agree with initial treatment of postop shoe, increasing aggressiveness with complete foot and ankle immobilizer to be worn for the next 4 weeks.if no better after 2 weeks of asked her to call me so we can order an MRI for further evaluation.   Return if symptoms worsen or fail to improve.

## 2015-04-07 ENCOUNTER — Ambulatory Visit (INDEPENDENT_AMBULATORY_CARE_PROVIDER_SITE_OTHER): Payer: BLUE CROSS/BLUE SHIELD | Admitting: Psychiatry

## 2015-04-07 ENCOUNTER — Encounter (HOSPITAL_COMMUNITY): Payer: Self-pay | Admitting: Psychiatry

## 2015-04-07 VITALS — BP 110/64 | HR 75 | Ht 66.0 in | Wt 200.0 lb

## 2015-04-07 DIAGNOSIS — F411 Generalized anxiety disorder: Secondary | ICD-10-CM | POA: Diagnosis not present

## 2015-04-07 DIAGNOSIS — F329 Major depressive disorder, single episode, unspecified: Secondary | ICD-10-CM

## 2015-04-07 DIAGNOSIS — F331 Major depressive disorder, recurrent, moderate: Secondary | ICD-10-CM

## 2015-04-07 DIAGNOSIS — F900 Attention-deficit hyperactivity disorder, predominantly inattentive type: Secondary | ICD-10-CM

## 2015-04-07 MED ORDER — CLONAZEPAM 1 MG PO TABS: 1.0000 mg | ORAL_TABLET | Freq: Every day | ORAL | Status: DC | PRN

## 2015-04-07 NOTE — Progress Notes (Signed)
Patient ID: Katrina Mcclain, female   DOB: 1975-11-25, 39 y.o.   MRN: [id number]  Katrina Mcclain Follow up Outpatient Visit  Katrina Mcclain [id number] 39 y.o.  04/07/2015 10:17 AM  Chief Complaint:  Anxiety and follow up.   History of Present Illness:  [name] is a caucasian married female. 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 Patient initially presented for "Evaluation with symptoms of feeling overwhelmed. History of  ADHD. Referred by primary care physician. Apparently she's been having more mood swings getting easily overwhelmed, stressful. Decreased sleep and decreased energy. Despite decrease energy she still keeps herself busy when she gets started into projects she cannot finish them she keeps in cleaning or taking care of things till she gets overwhelmed. She has to  keep track of her activities will write it down otherwise she forgets. She gets easily distracted and forgetful. Says that she was treated for ADHD in the past. For 5 years ago also she said that she is treated for ADHD and Wellbutrin did help but she started having hives."  We have kept her on Klonopin this indirectly also depression and anxiety. She feels less stressed. She is less stressed she is less distracted in her ADHD symptoms get better. She has to take care of the kids and sometimes they quarreled and this asked to stress. She takes Klonopin when necessary and not on a regular basis  Does not endorse any psychotic symptoms delusions hallucinations. No crying spells or suicidal or homicidal thoughts.  Aggravating factors. Gets overwhelmed at home's. She has 2 twin daughters age 86 she also has 3 other kids. Her 71-year-old son has been diagnosed with ADHD and at times his behavior is difficult to manage. Dealing with the kids gets frustrated and she loses patience.  Modifying factors; exercise eating healthy relationships also been on healthier not stressful with her  husband.  Depression scale; 7/10. 10 being no depression.  Endorses mood swings and feeling overwhelmed.  Timing: when around with kids or if have poor sleep. Context: Taking care of home     Past Psychiatric History/Hospitalization(s) One time overnight 22 years ago OD on pills when she was having difficult time with her x husband. Her stomach was pumped, says it was impulsive and she was not kept in the  hospital for long. Has been on different medications including Celexa, Paxil. Says that these medications have not helped much. She has been on Wellbutrin that that helped but the cost high so she stopped it there is no allergic breathing reactions to Wellbutrin  Medical History; Past Medical History  Diagnosis Date  . Depression   . Anxiety   . Headache     Allergies: Allergies  Allergen Reactions  . Wellbutrin [Bupropion] Hives    Medications: Outpatient Encounter Prescriptions as of 04/07/2015  Medication Sig  . clonazePAM (KLONOPIN) 1 MG tablet Take 1 tablet (1 mg total) by mouth daily as needed for anxiety.  . Magnesium Citrate 100 MG TABS Take 1 tablet by mouth.  . [DISCONTINUED] clonazePAM (KLONOPIN) 1 MG tablet Take 1 tablet (1 mg total) by mouth daily as needed for anxiety.   No facility-administered encounter medications on file as of 04/07/2015.    Family History; Family History  Problem Relation Age of Onset  . Cancer Mother     Breast  . Ovarian cysts Sister   . OCD Father        Musculoskeletal: Strength & Muscle Tone:  within normal limits Gait & Station: normal Patient leans: N/A  Mental Status Examination;   Psychiatric Specialty Exam: Physical Exam  Constitutional: She appears well-developed and well-nourished.    Review of Systems  Constitutional: Negative.   Psychiatric/Behavioral: Negative for depression and suicidal ideas. The patient is nervous/anxious.     Blood pressure 110/64, pulse 75, height 5\' 6"  (1.676 m), weight 200 lb  (90.719 kg), SpO2 96 %.Body mass index is 32.3 kg/(m^2).  General Appearance: Casual  Eye Contact::  Fair  Speech:  Normal Rate  Volume:  Normal  Mood:  Somewhat anxious  Affect:  Congruent  Thought Process:  Circumstantial  Orientation:  Full (Time, Place, and Person)  Thought Content:  Rumination  Suicidal Thoughts:  No  Homicidal Thoughts:  No  Memory:  Immediate;   Fair Recent;   Fair  Judgement:  Fair  Insight:  Fair  Psychomotor Activity:  Normal  Concentration:  Fair  Recall:  Fair  Akathisia:  Negative  Handed:  Right  AIMS (if indicated):     Assets:  Communication Skills Desire for Improvement Intimacy Physical Health Resilience  Sleep:        Assessment: Axis I: Depressive disorder unspecified. Generalized anxiety disorder. Rule out ADHD.   Axis II: Deferred  Axis III:  Past Medical History  Diagnosis Date  . Depression   . Anxiety   . Headache     Axis IV: Psychosocial   Treatment Plan and Summary: Holf off from SSRI. She does not want to be on any SSRIs for depression or anxiety. She does Klonopin which we will continue when necessary. This helps her depression, anxiety and also indefinitely her inattention. In case needed he can start her on a small dose of Lexapro but as of now she is not interested on adding another medication. Pertinent Labs and Relevant Prior Notes reviewed. Medication Side effects, benefits and risks reviewed/discussed with Patient. Time given for patient to respond and asks questions regarding the Diagnosis and Medications. Safety concerns and to report to ER if suicidal or call 911. Relevant Medications refilled or called in to pharmacy. Discussed weight maintenance and Sleep Hygiene. Follow up with Primary care provider in regards to Medical conditions. Recommend compliance with medications and follow up office appointments. Discussed to avail opportunity to consider or/and continue Individual therapy with  Counselor. Greater than 50% of time was spend in counseling and coordination of care with the patient.  Schedule for Follow up visit in  8 weeks  or call in earlier as necessary.   Merian Capron, MD 04/07/2015

## 2015-04-29 ENCOUNTER — Encounter: Payer: Self-pay | Admitting: Family Medicine

## 2015-04-29 ENCOUNTER — Ambulatory Visit (INDEPENDENT_AMBULATORY_CARE_PROVIDER_SITE_OTHER): Payer: BLUE CROSS/BLUE SHIELD | Admitting: Family Medicine

## 2015-04-29 VITALS — BP 94/65 | HR 80 | Wt 200.0 lb

## 2015-04-29 DIAGNOSIS — G5601 Carpal tunnel syndrome, right upper limb: Secondary | ICD-10-CM | POA: Diagnosis not present

## 2015-04-29 DIAGNOSIS — M79671 Pain in right foot: Secondary | ICD-10-CM

## 2015-04-29 NOTE — Progress Notes (Signed)
CC: Katrina Mcclain is a 39 y.o. female is here for 4 week f/u foot   Subjective: HPI:  Follow-up right foot pain: Pain is mostly resolved but still slightly noticeable to a mild degree daily basis. Still localized over the dorsal aspect of the right foot overlying the space between the third and fourth metatarsal. It's worse with weightbearing. It's better when wearing the immobilizing boot given to her last month, due to walking differently with the patient she was getting some knee pain so she did not wear it during all weightbearing hours. She worked the [employer] of the day for 3 weeks and then stopped. She denies any overlying skin changes.  She complains of right forearm pain described as a tightness in her distal volar forearm. It occasionally causes some tingling in her hands if her wrist is in a flexed position and tightness is more than just mild in severity. Symptoms are worse with typing. Denies any weakness or overlying skin changes.   Review Of Systems Outlined In HPI  Past Medical History  Diagnosis Date  . Depression   . Anxiety   . Headache     Past Surgical History  Procedure Laterality Date  . Cesarean section    . Cholecystectomy    . Tubal ligation     Family History  Problem Relation Age of Onset  . Cancer Mother     Breast  . Ovarian cysts Sister   . OCD Father     History   Social History  . Marital Status: Married    Spouse Name: N/A  . Number of Children: N/A  . Years of Education: N/A   Occupational History  . Not on file.   Social History Main Topics  . Smoking status: Never Smoker   . Smokeless tobacco: Never Used  . Alcohol Use: No  . Drug Use: No  . Sexual Activity: Yes    Birth Control/ Protection: Surgical   Other Topics Concern  . Not on file   Social History Narrative     Objective: BP 94/65 mmHg  Pulse 80  Wt 200 lb (90.719 kg)  Vital signs reviewed. General: Alert and Oriented, No Acute Distress HEENT: Pupils equal,  round, reactive to light. Conjunctivae clear.  External ears unremarkable.  Moist mucous membranes. Lungs: Clear and comfortable work of breathing, speaking in full sentences without accessory muscle use. Cardiac: Regular rate and rhythm.  Neuro: CN II-XII grossly intact, gait normal. Extremities: No peripheral edema.  Strong peripheral pulses. Positive Phalen's on the right  Mental Status: No depression, anxiety, nor agitation. Logical though process. Skin: Warm and dry.  Assessment & Plan: Katrina Mcclain was seen today for 4 week f/u foot.  Diagnoses and all orders for this visit:  Right foot pain  Carpal tunnel syndrome, right    Since symptoms are lingering and my suspicion of Morton's neuroma is still high, referral to Dr. Darene Lamer for second opinion. Joint decision to avoid MRI at this time. Carpal tunnel syndrome of the right hand: she was given home rehabilitative exercises to follow for the next 2-3 weeks.  25 minutes spent face-to-face during visit today of which at least 50% was counseling or coordinating care regarding: 1. Right foot pain   2. Carpal tunnel syndrome, right      Return for Anytime appt with Dr. Darene Lamer for Right Foot Pain.

## 2015-05-05 ENCOUNTER — Other Ambulatory Visit: Payer: Self-pay | Admitting: Physician Assistant

## 2015-05-07 ENCOUNTER — Other Ambulatory Visit: Payer: Self-pay | Admitting: *Deleted

## 2015-05-07 DIAGNOSIS — B3731 Acute candidiasis of vulva and vagina: Secondary | ICD-10-CM

## 2015-05-07 DIAGNOSIS — B373 Candidiasis of vulva and vagina: Secondary | ICD-10-CM

## 2015-05-07 MED ORDER — FLUCONAZOLE 150 MG PO TABS: ORAL_TABLET | ORAL | Status: AC

## 2015-05-12 ENCOUNTER — Institutional Professional Consult (permissible substitution): Payer: Self-pay | Admitting: Sports Medicine

## 2015-07-08 ENCOUNTER — Ambulatory Visit (HOSPITAL_COMMUNITY): Payer: Self-pay | Admitting: Psychiatry

## 2015-08-07 ENCOUNTER — Ambulatory Visit (INDEPENDENT_AMBULATORY_CARE_PROVIDER_SITE_OTHER): Payer: BLUE CROSS/BLUE SHIELD | Admitting: Physician Assistant

## 2015-08-07 ENCOUNTER — Encounter: Payer: Self-pay | Admitting: Physician Assistant

## 2015-08-07 VITALS — BP 131/70 | HR 87 | Temp 98.3°F | Ht 66.0 in | Wt 198.0 lb

## 2015-08-07 DIAGNOSIS — J069 Acute upper respiratory infection, unspecified: Secondary | ICD-10-CM | POA: Diagnosis not present

## 2015-08-07 DIAGNOSIS — J309 Allergic rhinitis, unspecified: Secondary | ICD-10-CM

## 2015-08-07 MED ORDER — AZITHROMYCIN 250 MG PO TABS: ORAL_TABLET | ORAL | Status: DC

## 2015-08-07 MED ORDER — METHYLPREDNISOLONE SODIUM SUCC 125 MG IJ SOLR
125.0000 mg | Freq: Once | INTRAMUSCULAR | Status: AC
  Administered 2015-08-07: 125 mg via INTRAMUSCULAR

## 2015-08-07 NOTE — Progress Notes (Signed)
   Subjective:    Patient ID: Katrina Mcclain, female    DOB: 07-Jan-1976, 39 y.o.   MRN: [id number]  HPI  Patient is a 39 year old female who presents to the clinic with chief complaint of postnasal drip and sinus pressure. Her symptoms started 3 days ago. She was awakened last night with lots of sinus drainage. Her sinus pressure seems to be getting worse. She did take Sudafed and it did help. She has been taking Zyrtec daily for the last 2 weeks. She denies any fever, chills, shortness of breath or wheezing.    Review of Systems  All other systems reviewed and are negative.      Objective:   Physical Exam  Constitutional: She is oriented to person, place, and time. She appears well-developed and well-nourished.  HENT:  Head: Normocephalic and atraumatic.  Right Ear: External ear normal.  Left Ear: External ear normal.  Mouth/Throat: Oropharynx is clear and moist. No oropharyngeal exudate.  TMs were clear bilaterally.  Oropharynx had some postnasal drip but no tonsillar swelling, exudate or erythema.  There was some tenderness over frontal and left maxillary sinuses.  Bilateral nasal turbinates red and swollen.   Eyes: Conjunctivae are normal. Right eye exhibits no discharge. Left eye exhibits no discharge.  Neck: Normal range of motion. Neck supple.  There was some small lymph nodes swelling of the left anterior cervical nodes that were slightly tender to palpation.  Cardiovascular: Normal rate, regular rhythm and normal heart sounds.   Pulmonary/Chest: Effort normal and breath sounds normal. She has no wheezes.  Lymphadenopathy:    She has cervical adenopathy.  Neurological: She is alert and oriented to person, place, and time.  Skin: Skin is dry.  Psychiatric: She has a normal mood and affect. Her behavior is normal.          Assessment & Plan:  Upper respiratory infection/allergic sinusitis-reassured patient I did not see any signs of bacterial infection today. I do feel  like this is more of an allergic response. She has been taking Zyrtec. Suggest her switching up to Allegra or Claritin. Encourage use of Mucinex D. Also suggested Flonase nasal spray to start daily. Shot of Solu-Medrol 125 mg IM given in office today. If not improving she was printed out a prescription for azithromycin to [facility]

## 2015-08-31 ENCOUNTER — Encounter: Payer: Self-pay | Admitting: Family Medicine

## 2015-08-31 ENCOUNTER — Ambulatory Visit (INDEPENDENT_AMBULATORY_CARE_PROVIDER_SITE_OTHER): Payer: BLUE CROSS/BLUE SHIELD | Admitting: Family Medicine

## 2015-08-31 VITALS — BP 116/67 | HR 77 | Temp 98.1°F | Wt 194.0 lb

## 2015-08-31 DIAGNOSIS — N390 Urinary tract infection, site not specified: Secondary | ICD-10-CM | POA: Insufficient documentation

## 2015-08-31 DIAGNOSIS — N3000 Acute cystitis without hematuria: Secondary | ICD-10-CM | POA: Diagnosis not present

## 2015-08-31 DIAGNOSIS — R300 Dysuria: Secondary | ICD-10-CM

## 2015-08-31 LAB — POCT URINALYSIS DIPSTICK
BILIRUBIN UA: NEGATIVE
Glucose, UA: NEGATIVE
Ketones, UA: 80
NITRITE UA: NEGATIVE
PH UA: 6.5
Protein, UA: 0.2
RBC UA: NEGATIVE
SPEC GRAV UA: 1.01
Urobilinogen, UA: 0.2

## 2015-08-31 MED ORDER — CEPHALEXIN 500 MG PO CAPS
500.0000 mg | ORAL_CAPSULE | Freq: Two times a day (BID) | ORAL | Status: DC
Start: 2015-08-31 — End: 2015-10-08

## 2015-08-31 MED ORDER — FLUCONAZOLE 150 MG PO TABS
150.0000 mg | ORAL_TABLET | Freq: Once | ORAL | Status: DC
Start: 2015-08-31 — End: 2015-10-08

## 2015-08-31 NOTE — Assessment & Plan Note (Signed)
Symptoms consistent with UTI. Empiric treatment with Keflex. Additionally treated with consult case patient develops a yeast infection. Culture pending.

## 2015-08-31 NOTE — Patient Instructions (Signed)
Thank you for coming in today. If your belly pain worsens, or you have high fever, bad vomiting, blood in your stool or black tarry stool go to the Emergency Room.   Urinary Tract Infection Urinary tract infections (UTIs) can develop anywhere along your urinary tract. Your urinary tract is your body's drainage system for removing wastes and extra water. Your urinary tract includes two kidneys, two ureters, a bladder, and a urethra. Your kidneys are a pair of bean-shaped organs. Each kidney is about the size of your fist. They are located below your ribs, one on each side of your spine. CAUSES Infections are caused by microbes, which are microscopic organisms, including fungi, viruses, and bacteria. These organisms are so small that they can only be seen through a microscope. Bacteria are the microbes that most commonly cause UTIs. SYMPTOMS  Symptoms of UTIs may vary by age and gender of the patient and by the location of the infection. Symptoms in young women typically include a frequent and intense urge to urinate and a painful, burning feeling in the bladder or urethra during urination. Older women and men are more likely to be tired, shaky, and weak and have muscle aches and abdominal pain. A fever may mean the infection is in your kidneys. Other symptoms of a kidney infection include pain in your back or sides below the ribs, nausea, and vomiting. DIAGNOSIS To diagnose a UTI, your caregiver will ask you about your symptoms. Your caregiver will also ask you to provide a urine sample. The urine sample will be tested for bacteria and white blood cells. White blood cells are made by your body to help fight infection. TREATMENT  Typically, UTIs can be treated with medication. Because most UTIs are caused by a bacterial infection, they usually can be treated with the use of antibiotics. The choice of antibiotic and length of treatment depend on your symptoms and the type of bacteria causing your  infection. HOME CARE INSTRUCTIONS  If you were prescribed antibiotics, take them exactly as your caregiver instructs you. Finish the medication even if you feel better after you have only taken some of the medication.  Drink enough water and fluids to keep your urine clear or pale yellow.  Avoid caffeine, tea, and carbonated beverages. They tend to irritate your bladder.  Empty your bladder often. Avoid holding urine for long periods of time.  Empty your bladder before and after sexual intercourse.  After a bowel movement, women should cleanse from front to back. Use each tissue only once. SEEK MEDICAL CARE IF:   You have back pain.  You develop a fever.  Your symptoms do not begin to resolve within 3 days. SEEK IMMEDIATE MEDICAL CARE IF:   You have severe back pain or lower abdominal pain.  You develop chills.  You have nausea or vomiting.  You have continued burning or discomfort with urination. MAKE SURE YOU:   Understand these instructions.  Will watch your condition.  Will get help right away if you are not doing well or get worse.   This information is not intended to replace advice given to you by your health care provider. Make sure you discuss any questions you have with your health care provider.   Document Released: 08/17/2005 Document Revised: 07/29/2015 Document Reviewed: 12/16/2011 Elsevier Interactive Patient Education [employer].

## 2015-08-31 NOTE — Progress Notes (Signed)
Katrina Mcclain is a 39 y.o. female who presents to [facility]  today for a frequency urgency dysuria present for one week. Symptoms consistent with previous episodes of UTI. No fevers chills nausea vomiting or diarrhea. No treatments tried yet aside from some over-the-counter symptomatic management medications.   Past Medical History  Diagnosis Date  . Depression   . Anxiety   . Headache    Past Surgical History  Procedure Laterality Date  . Cesarean section    . Cholecystectomy    . Tubal ligation     Social History  Substance Use Topics  . Smoking status: Never Smoker   . Smokeless tobacco: Never Used  . Alcohol Use: No   family history includes Cancer in her mother; OCD in her father; Ovarian cysts in her sister.  ROS as above Medications: Current Outpatient Prescriptions  Medication Sig Dispense Refill  . clonazePAM (KLONOPIN) 1 MG tablet Take 1 tablet (1 mg total) by mouth daily as needed for anxiety. 30 tablet 0  . Magnesium Citrate 100 MG TABS Take 1 tablet by mouth.    . cephALEXin (KEFLEX) 500 MG capsule Take 1 capsule (500 mg total) by mouth 2 (two) times daily. 14 capsule 0  . fluconazole (DIFLUCAN) 150 MG tablet Take 1 tablet (150 mg total) by mouth once. 1 tablet 1   No current facility-administered medications for this visit.   Allergies  Allergen Reactions  . Wellbutrin [Bupropion] Hives     Exam:  BP 116/67 mmHg  Pulse 77  Temp(Src) 98.1 F (36.7 C) (Oral)  Wt 194 lb (87.998 kg) Gen: Well NAD HEENT: EOMI,  MMM Lungs: Normal work of breathing. CTABL Heart: RRR no MRG Abd: NABS, Soft. Nondistended, Nontender no CV angle tenderness to percussion Exts: Brisk capillary refill, warm and well perfused.   Results for orders placed or performed in visit on 08/31/15 (from the past 24 hour(s))  POCT Urinalysis Dipstick     Status: Abnormal   Collection Time: 08/31/15  3:59 PM  Result Value Ref Range   Color, UA yellow     Clarity, UA clear    Glucose, UA neg    Bilirubin, UA neg    Ketones, UA 80    Spec Grav, UA 1.010    Blood, UA neg    pH, UA 6.5    Protein, UA 0.2    Urobilinogen, UA 0.2    Nitrite, UA neg    Leukocytes, UA small (1+) (A) Negative   No results found.   Please see individual assessment and plan sections.

## 2015-09-04 LAB — URINE CULTURE

## 2015-10-08 ENCOUNTER — Ambulatory Visit (INDEPENDENT_AMBULATORY_CARE_PROVIDER_SITE_OTHER): Payer: BLUE CROSS/BLUE SHIELD

## 2015-10-08 ENCOUNTER — Telehealth: Payer: Self-pay | Admitting: Sports Medicine

## 2015-10-08 ENCOUNTER — Ambulatory Visit (INDEPENDENT_AMBULATORY_CARE_PROVIDER_SITE_OTHER): Payer: BLUE CROSS/BLUE SHIELD | Admitting: Sports Medicine

## 2015-10-08 DIAGNOSIS — M79671 Pain in right foot: Secondary | ICD-10-CM

## 2015-10-08 NOTE — Telephone Encounter (Signed)
Pt walked in and stated she just got the xray and she stated she has tried several boots in the past and was wondering if she can just do the cast instead of trying the boot for the next month. Also should she wait on the orthotics if she is wanting the cast.

## 2015-10-08 NOTE — Assessment & Plan Note (Signed)
Pain is directly over the fourth and third metatarsal shafts with swelling suggestive of stress injury. Restart boot, and we will do this for a solid month, we did discuss that stress injuries can take 1-3 months to heal. I do want a new set of x-rays and she will return for custom orthotics.

## 2015-10-08 NOTE — Progress Notes (Signed)
   Subjective:    I'm seeing this patient as a consultation for:  Dr. Marcial Pacas  CC: Right foot pain  HPI: This is a pleasant 39 year old female who's had a history over some time of pain that she localizes over the dorsum of the right foot, over the third and fourth metatarsal chest, moderate, persistent, no radiation. She has been doing Interior and spatial designer. She also notes that her shoes been fitting tighter on the right side, no calf pain or from some breath. No trauma. X-rays were done in the recent past that were negative.  Past medical history, Surgical history, Family history not pertinant except as noted below, Social history, Allergies, and medications have been entered into the medical record, reviewed, and no changes needed.   Review of Systems: No headache, visual changes, nausea, vomiting, diarrhea, constipation, dizziness, abdominal pain, skin rash, fevers, chills, night sweats, weight loss, swollen lymph nodes, body aches, joint swelling, muscle aches, chest pain, shortness of breath, mood changes, visual or auditory hallucinations.   Objective:   General: Well Developed, well nourished, and in no acute distress.  Neuro/Psych: Alert and oriented x3, extra-ocular muscles intact, able to move all 4 extremities, sensation grossly intact. Skin: Warm and dry, no rashes noted.  Respiratory: Not using accessory muscles, speaking in full sentences, trachea midline.  Cardiovascular: Pulses palpable, no extremity edema. Abdomen: Does not appear distended. Right Foot: No visible erythema or swelling. Range of motion is full in all directions. Strength is 5/5 in all directions. No hallux valgus. No pes cavus or pes planus. No abnormal callus noted. No pain over the navicular prominence, or base of fifth metatarsal. No tenderness to palpation of the calcaneal insertion of plantar fascia. No pain at the Achilles insertion. No pain over the calcaneal bursa. No pain of the retrocalcaneal  bursa. Tender to palpation over the dorsum of the third and fourth metatarsal shafts, negative Homans sign. No hallux rigidus or limitus. No tenderness palpation over interphalangeal joints. No pain with compression of the metatarsal heads. Neurovascularly intact distally.  Impression and Recommendations:   This case required medical decision making of moderate complexity.

## 2015-10-08 NOTE — Telephone Encounter (Signed)
Yes, that is fine, she can probably come in for a nurse visit for the cast, a postop shoe should be placed on the bottoms of that she can bear some weight. If she would like me to place the cast I'm happy to see her as well at my next available 15 minute slot.

## 2015-10-09 NOTE — Telephone Encounter (Signed)
Pt scheduled an appt for Monday.

## 2015-10-12 ENCOUNTER — Ambulatory Visit (INDEPENDENT_AMBULATORY_CARE_PROVIDER_SITE_OTHER): Payer: BLUE CROSS/BLUE SHIELD | Admitting: Sports Medicine

## 2015-10-12 VITALS — Wt 192.0 lb

## 2015-10-12 DIAGNOSIS — M79671 Pain in right foot: Secondary | ICD-10-CM

## 2015-10-12 NOTE — Progress Notes (Signed)
  Subjective:    CC: Right foot pain  HPI: This is a pleasant 39 year old female, we treated her for a suspected metatarsal stress injury at the last visit, she returns with persistent pain, she does desire cast immobilization. Pain is moderate, persistent, localized over the third and fourth metatarsal shafts with mild swelling.   Past medical history, Surgical history, Family history not pertinant except as noted below, Social history, Allergies, and medications have been entered into the medical record, reviewed, and no changes needed.   Review of Systems: No fevers, chills, night sweats, weight loss, chest pain, or shortness of breath.   Objective:    General: Well Developed, well nourished, and in no acute distress.  Neuro: Alert and oriented x3, extra-ocular muscles intact, sensation grossly intact.  HEENT: Normocephalic, atraumatic, pupils equal round reactive to light, neck supple, no masses, no lymphadenopathy, thyroid nonpalpable.  Skin: Warm and dry, no rashes. Cardiac: Regular rate and rhythm, no murmurs rubs or gallops, no lower extremity edema.  Respiratory: Clear to auscultation bilaterally. Not using accessory muscles, speaking in full sentences.  Short leg cast placed.  Impression and Recommendations:

## 2015-10-12 NOTE — Assessment & Plan Note (Signed)
Short leg cast as above, 3 weeks of immobilization and and return to see me for cast removal.

## 2015-10-22 ENCOUNTER — Other Ambulatory Visit: Payer: Self-pay | Admitting: Physician Assistant

## 2015-10-22 DIAGNOSIS — Z1231 Encounter for screening mammogram for malignant neoplasm of breast: Secondary | ICD-10-CM

## 2015-11-04 ENCOUNTER — Ambulatory Visit (INDEPENDENT_AMBULATORY_CARE_PROVIDER_SITE_OTHER): Payer: BLUE CROSS/BLUE SHIELD

## 2015-11-04 DIAGNOSIS — Z1231 Encounter for screening mammogram for malignant neoplasm of breast: Secondary | ICD-10-CM | POA: Diagnosis not present

## 2015-11-06 ENCOUNTER — Telehealth: Payer: Self-pay

## 2015-11-06 NOTE — Telephone Encounter (Signed)
-----   Message from Donella Stade, [place] sent at 11/05/2015  5:39 PM EST ----- Call pt: mammogram normal. Follow up in 1 year.

## 2015-11-06 NOTE — Telephone Encounter (Signed)
Patient informed of mammogram results and recommendations.  

## 2015-11-09 ENCOUNTER — Encounter: Payer: Self-pay | Admitting: Sports Medicine

## 2015-11-09 ENCOUNTER — Ambulatory Visit (INDEPENDENT_AMBULATORY_CARE_PROVIDER_SITE_OTHER): Payer: BLUE CROSS/BLUE SHIELD | Admitting: Sports Medicine

## 2015-11-09 VITALS — BP 109/71 | HR 73 | Temp 98.0°F | Resp 18 | Wt 199.4 lb

## 2015-11-09 DIAGNOSIS — M79671 Pain in right foot: Secondary | ICD-10-CM

## 2015-11-09 DIAGNOSIS — Z23 Encounter for immunization: Secondary | ICD-10-CM

## 2015-11-09 NOTE — Assessment & Plan Note (Signed)
No longer with tenderness over the third and fourth metatarsal shafts after 3 weeks of cast immobilization. At this point she will continue the postop shoe for an additional 2 weeks before discontinuing and proceeding with normal footwear. Return in 2 weeks.

## 2015-11-09 NOTE — Progress Notes (Signed)
  Subjective:    CC: Follow-up right foot  HPI:  This is a pleasant 39 year old female, she returns After 3 weeks of cast immobilization for suspected third and fourth metatarsal stress injuries, overall she is doing well without complaints, ear to get out of the cast.  Past medical history, Surgical history, Family history not pertinant except as noted below, Social history, Allergies, and medications have been entered into the medical record, reviewed, and no changes needed.   Review of Systems: No fevers, chills, night sweats, weight loss, chest pain, or shortness of breath.   Objective:    General: Well Developed, well nourished, and in no acute distress.  Neuro: Alert and oriented x3, extra-ocular muscles intact, sensation grossly intact.  HEENT: Normocephalic, atraumatic, pupils equal round reactive to light, neck supple, no masses, no lymphadenopathy, thyroid nonpalpable.  Skin: Warm and dry, no rashes. Cardiac: Regular rate and rhythm, no murmurs rubs or gallops, no lower extremity edema.  Respiratory: Clear to auscultation bilaterally. Not using accessory muscles, speaking in full sentences. Right Foot: Cast is removed  No visible erythema or swelling. Range of motion is full in all directions. Strength is 5/5 in all directions. No hallux valgus. No pes cavus or pes planus. No abnormal callus noted. No pain over the navicular prominence, or base of fifth metatarsal. No tenderness to palpation of the calcaneal insertion of plantar fascia. No pain at the Achilles insertion. No pain over the calcaneal bursa. No pain of the retrocalcaneal bursa. No tenderness to palpation over the tarsals, metatarsals, or phalanges, specifically no tenderness over the third and fourth metatarsal shafts. No hallux rigidus or limitus. No tenderness palpation over interphalangeal joints. No pain with compression of the metatarsal heads. Neurovascularly intact distally.  Impression and  Recommendations:

## 2015-11-17 ENCOUNTER — Ambulatory Visit (INDEPENDENT_AMBULATORY_CARE_PROVIDER_SITE_OTHER): Payer: BLUE CROSS/BLUE SHIELD | Admitting: Osteopathic Medicine

## 2015-11-17 ENCOUNTER — Telehealth: Payer: Self-pay

## 2015-11-17 ENCOUNTER — Encounter: Payer: Self-pay | Admitting: Osteopathic Medicine

## 2015-11-17 VITALS — BP 109/58 | HR 74 | Temp 98.1°F | Ht 66.0 in | Wt 202.0 lb

## 2015-11-17 DIAGNOSIS — N3000 Acute cystitis without hematuria: Secondary | ICD-10-CM | POA: Diagnosis not present

## 2015-11-17 DIAGNOSIS — R3915 Urgency of urination: Secondary | ICD-10-CM

## 2015-11-17 DIAGNOSIS — M79671 Pain in right foot: Secondary | ICD-10-CM

## 2015-11-17 LAB — POCT URINALYSIS DIPSTICK
BILIRUBIN UA: NEGATIVE
Blood, UA: NEGATIVE
GLUCOSE UA: NEGATIVE
KETONES UA: NEGATIVE
Nitrite, UA: NEGATIVE
PH UA: 7
Protein, UA: NEGATIVE
Spec Grav, UA: 1.01
Urobilinogen, UA: 0.2

## 2015-11-17 MED ORDER — CIPROFLOXACIN HCL 500 MG PO TABS: 500.0000 mg | ORAL_TABLET | Freq: Two times a day (BID) | ORAL | Status: DC

## 2015-11-17 NOTE — Telephone Encounter (Signed)
Patient called complaining of UTI symptoms.  She says this is a chronic problem.  Advised patient she would need to be evaluated for issue.  Appointment made.

## 2015-11-17 NOTE — Progress Notes (Signed)
Chief Complaint: Possible UTI  History of Present Illness: Katrina Mcclain is a 39 y.o. female who presents to [facility]  today with concerns for acute urinary tract infection, questions about foot pain  Onset: 2 days   Quality: Burning/Urgency Associated Symptoms:   Frequency: yes  Hematuria: no  Odor: no  Fever/chills: no  Incontinence: no  Flank Pain: no  Vaginal bleeding/discharge: no Modifying Factors:  Previous UTI: 08/2015 (2 months ago)   Recurrent UTI (3 times/more annually): no  Abx in past 3 months: yes see below [id number]  Complicated (any of the following): no ?Diabetes ?Pregnancy ?Symptoms for seven or more days before seeking care ?Hospital acquired infection ?Renal failure ?Urinary tract obstruction ?Presence of an indwelling urethral catheter, stent, nephrostomy tube or urinary diversion ?Functional or anatomic abnormality of the urinary tract ?Renal transplantation ?Immunosuppression   FOOT PAIN: Following with Dr Darene Lamer, treated for stress fracture, pt requests notes printed for her insurance, has questions about possible MRI  Past medical, social and family history reviewed: Past Medical History  Diagnosis Date  . Depression   . Anxiety   . Headache    Past Surgical History  Procedure Laterality Date  . Cesarean section    . Cholecystectomy    . Tubal ligation     Social History  Substance Use Topics  . Smoking status: Never Smoker   . Smokeless tobacco: Never Used  . Alcohol Use: No   The patient has a family history of  Current Outpatient Prescriptions  Medication Sig Dispense Refill  . clonazePAM (KLONOPIN) 1 MG tablet Take 1 tablet (1 mg total) by mouth daily as needed for anxiety. 30 tablet 0  . Magnesium Citrate 100 MG TABS Take 1 tablet by mouth.     No current facility-administered medications for this visit.   Allergies  Allergen Reactions  . Wellbutrin [Bupropion] Hives     Review of  Systems: CONSTITUTIONAL: Negative fever/chills CARDIAC: No chest pain/pressure/palpitations, no orthopnea RESPIRATORY: No cough/shortness of breath/wheeze GASTROINTESTINAL: No nausea/vomiting/abdominal pain/blood in stool/diarrhea/constipation MUSCULOSKELETAL: No myalgia/arthralgia/back pain, (+) foot pain a bit better GENITOURINARY: Chronic incontinence, Negative abnormal genital bleeding/discharge. (+)Dysuria/UTI symptoms as per HPI   Exam:  BP 109/58 mmHg  Pulse 74  Temp(Src) 98.1 F (36.7 C) (Oral)  Ht 5\' 6"  (1.676 m)  Wt 202 lb (91.627 kg)  BMI 32.62 kg/m2 Constitutional: VSS, see above. General Appearance: alert, well-developed, well-nourished, NAD Respiratory: Normal respiratory effort. Breath sounds normal, no wheeze/rhonchi/rales Cardiovascular: S1/S2 normal, no murmur/rub/gallop auscultated. RRR Gastrointestinal: Nontender, no masses. No hepatomegaly, no splenomegaly. No hernia appreciated. Rectal exam deferred.  Musculoskeletal: Gait normal. No clubbing/cyanosis of digits. Lloyd sign Negative bilateral, R foot in postop shoe for imobilizaiton  Results for orders placed or performed in visit on 11/17/15 (from the past 24 hour(s))  POCT Urinalysis Dipstick     Status: Abnormal   Collection Time: 11/17/15  3:07 PM  Result Value Ref Range   Color, UA YELLOW    Clarity, UA CLEAR    Glucose, UA NEG    Bilirubin, UA NEG    Ketones, UA NEG    Spec Grav, UA 1.010    Blood, UA NEG    pH, UA 7.0    Protein, UA NEG    Urobilinogen, UA 0.2    Nitrite, UA NEG    Leukocytes, UA small (1+) (A) Negative    Previous Culture Results Reviewed:  08/31/15 E Coli resistant to TMP/SMX, resistant to Ampicillin, sensitive to  Cipro. Treated with Keflex by Dr Georgina Snell.   Spoke to Dr Dianah Field - pt can contact him if foot worse, based on his last exam visit with pt that an MRI is probably not needed but would recommend office visit to address this.   ASSESSMENT/PLAN:  Urgency of  urination - Plan: POCT Urinalysis Dipstick, Urine Culture, will call if need to change abx based on cx results  Acute cystitis without hematuria - Plan: ciprofloxacin (CIPRO) 500 MG tablet  Right foot pain - followup with Dr. Darene Lamer. I printed last 2 progress notes for her insurance purposes    Return if symptoms worsen or fail to improve.

## 2015-11-20 ENCOUNTER — Telehealth: Payer: Self-pay | Admitting: Sports Medicine

## 2015-11-20 DIAGNOSIS — M79671 Pain in right foot: Secondary | ICD-10-CM

## 2015-11-20 LAB — URINE CULTURE: Colony Count: >=100000

## 2015-11-20 NOTE — Telephone Encounter (Signed)
Initially did well after a short-term period of cast immobilization for suspected metatarsal stress fracture, unfortunately having recurrence of pain so we will order an MRI.

## 2015-11-24 ENCOUNTER — Ambulatory Visit: Payer: Self-pay | Admitting: Sports Medicine

## 2016-03-02 ENCOUNTER — Ambulatory Visit (INDEPENDENT_AMBULATORY_CARE_PROVIDER_SITE_OTHER): Payer: BLUE CROSS/BLUE SHIELD | Admitting: Physician Assistant

## 2016-03-02 ENCOUNTER — Encounter: Payer: Self-pay | Admitting: Physician Assistant

## 2016-03-02 VITALS — BP 113/59 | HR 82 | Ht 66.0 in | Wt 216.0 lb

## 2016-03-02 DIAGNOSIS — L298 Other pruritus: Secondary | ICD-10-CM

## 2016-03-02 DIAGNOSIS — N390 Urinary tract infection, site not specified: Secondary | ICD-10-CM | POA: Diagnosis not present

## 2016-03-02 DIAGNOSIS — N898 Other specified noninflammatory disorders of vagina: Secondary | ICD-10-CM | POA: Diagnosis not present

## 2016-03-02 DIAGNOSIS — R82998 Other abnormal findings in urine: Secondary | ICD-10-CM

## 2016-03-02 DIAGNOSIS — K0889 Other specified disorders of teeth and supporting structures: Secondary | ICD-10-CM

## 2016-03-02 DIAGNOSIS — K029 Dental caries, unspecified: Secondary | ICD-10-CM

## 2016-03-02 DIAGNOSIS — R350 Frequency of micturition: Secondary | ICD-10-CM | POA: Diagnosis not present

## 2016-03-02 LAB — WET PREP FOR TRICH, YEAST, CLUE
Clue Cells Wet Prep HPF POC: NONE SEEN
TRICH WET PREP: NONE SEEN
Yeast Wet Prep HPF POC: NONE SEEN

## 2016-03-02 LAB — POCT URINALYSIS DIPSTICK
BILIRUBIN UA: NEGATIVE
Glucose, UA: NEGATIVE
Ketones, UA: NEGATIVE
NITRITE UA: NEGATIVE
PH UA: 6
Protein, UA: NEGATIVE
RBC UA: NEGATIVE
SPEC GRAV UA: 1.025
Urobilinogen, UA: 0.2

## 2016-03-02 MED ORDER — AMOXICILLIN-POT CLAVULANATE 875-125 MG PO TABS
1.0000 | ORAL_TABLET | Freq: Two times a day (BID) | ORAL | Status: DC
Start: 2016-03-02 — End: 2016-04-20

## 2016-03-02 NOTE — Progress Notes (Signed)
   Subjective:    Patient ID: Katrina Mcclain, female    DOB: 10-Feb-1976, 40 y.o.   MRN: [id number]  HPI  Pt presents to the clinic with urinary urgency, frequency and burning for 6 days. She also has some discharge with itching. She has tried nothing to make better. Seems to be worsening. No flank or abdominal pain. No fever, chills, n/v/d. Denies any vaginal odor. She also has some right sided tooth pain. Hx of dental caries and root canal in that tooth. Has appt with dentist in a week. Concerned about infection.     Review of Systems  All other systems reviewed and are negative.      Objective:   Physical Exam  Constitutional: She is oriented to person, place, and time. She appears well-developed and well-nourished.  HENT:  Head: Normocephalic and atraumatic.  Right lower molar tender to palpation. No visual abscess. Mouth full of fillings and missing teeth. Poor dentition.   Cardiovascular: Normal rate, regular rhythm and normal heart sounds.   Pulmonary/Chest: Effort normal and breath sounds normal.  No CVA tenderness.   Abdominal: Soft. Bowel sounds are normal. She exhibits no distension and no mass. There is no tenderness. There is no rebound and no guarding.  Neurological: She is alert and oriented to person, place, and time.  Skin: Skin is dry.  Psychiatric: She has a normal mood and affect. Her behavior is normal.          Assessment & Plan:  Urinary urgency/vaginal discharge/dysuria- UA positive for leuks only. Since symptomatic will treat. Choose augmentin since having some dental issues as well. Will culture and make sure sensitive to bacteria. Wet prep done to see if there could be a cause for discharge. Symptomatic care discussed. Follow up as needed.    Tooth pain/dental caries- will treat with augmentin for 10 days. Has dentist appt in 1 week to look at tooth. Ibuprofen for pain control.

## 2016-03-04 ENCOUNTER — Encounter: Payer: Self-pay | Admitting: Physician Assistant

## 2016-03-04 LAB — URINE CULTURE

## 2016-04-20 ENCOUNTER — Encounter: Payer: Self-pay | Admitting: Physician Assistant

## 2016-04-20 ENCOUNTER — Ambulatory Visit (INDEPENDENT_AMBULATORY_CARE_PROVIDER_SITE_OTHER): Payer: BLUE CROSS/BLUE SHIELD | Admitting: Physician Assistant

## 2016-04-20 VITALS — BP 107/60 | HR 90 | Ht 66.0 in | Wt 212.0 lb

## 2016-04-20 DIAGNOSIS — F411 Generalized anxiety disorder: Secondary | ICD-10-CM | POA: Diagnosis not present

## 2016-04-20 DIAGNOSIS — F900 Attention-deficit hyperactivity disorder, predominantly inattentive type: Secondary | ICD-10-CM

## 2016-04-20 DIAGNOSIS — G479 Sleep disorder, unspecified: Secondary | ICD-10-CM | POA: Diagnosis not present

## 2016-04-20 MED ORDER — ATOMOXETINE HCL 40 MG PO CAPS: ORAL_CAPSULE | ORAL | Status: DC

## 2016-04-20 NOTE — Progress Notes (Signed)
   Subjective:    Patient ID: Katrina Mcclain, female    DOB: 09-15-1976, 40 y.o.   MRN: [id number]  HPI  Patient is a 40 year old female who presents to the clinic with worsening stress and anxiety. She has been previously diagnosed with ADHD but currently not being treated. She does not like the way stimulants historically have made her feel. She feels like she is very overwhelmed. 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 She is finding it harder and harder to follow sleep at night. She feels like her mind runs through the day and everything she needs to do. She has seen our behavioral health doctors downstairs previously but has been over a year. They have given her some Klonopin to use as needed. She denies any suicidal or homicidal thoughts. She has tried trazodone for sleep in the past and it has helped some. She admits she is not exercising or eating healthy.    Review of Systems  All other systems reviewed and are negative.      Objective:   Physical Exam  Constitutional: She is oriented to person, place, and time. She appears well-developed and well-nourished.  HENT:  Head: Normocephalic and atraumatic.  Cardiovascular: Normal rate, regular rhythm and normal heart sounds.   Pulmonary/Chest: Effort normal and breath sounds normal.  Neurological: She is alert and oriented to person, place, and time.  Psychiatric: She has a normal mood and affect. Her behavior is normal.          Assessment & Plan:  ADHD/Anxiety/sleeping difficulties- GAD-7 was 15. PH Q-9 was 11. After speaking with patient I feel like perhaps her ADHD not being treated is causing increased anxiety and even sleep disturbances. She has not tolerated Wellbutrin in the past due to hives. She's tried SSRIs which helps some but have notoriously giving her decreased libido and weight gain. I would like to try Strattera to focus more on ADHD symptoms. I wrote her a  taper pack. She has trazodone at home encouraged her to restart up to 100 mg 8 hours before she needs to get up. I did also mention her trying melatonin with trazodone to see if she can keep to a lower dose. I encouraged exercise and healthy diet. Patient will print coupon card since we do not have any in the office. If too expensive we will likely try to start an antidepressant such as Viibryd or Trintellix since they have the least side effects. Follow up in 4-6 weeks.

## 2016-04-20 NOTE — Patient Instructions (Signed)
Take trazodone with melatonin up to 10mg  to help get to sleep.

## 2016-04-21 ENCOUNTER — Telehealth: Payer: Self-pay | Admitting: *Deleted

## 2016-04-21 NOTE — Telephone Encounter (Signed)
Patient called back and states she has tried adderall and it made her anxiety worse. PA submitted

## 2016-04-21 NOTE — Telephone Encounter (Signed)
Trying to submit a PA for Strattera. Called and left message on vm to ask  what she has tried and failed in the past for ADHD. Just by looking through chart I think she has not ever been on medication for this but wanted to confirm

## 2016-04-22 NOTE — Telephone Encounter (Signed)
PA has been approved    Key: [id number] - PA Case ID: [id number]  Pt and pharmacy notified

## 2016-06-01 ENCOUNTER — Encounter: Payer: Self-pay | Admitting: Physician Assistant

## 2016-06-01 ENCOUNTER — Ambulatory Visit (INDEPENDENT_AMBULATORY_CARE_PROVIDER_SITE_OTHER): Payer: BLUE CROSS/BLUE SHIELD | Admitting: Physician Assistant

## 2016-06-01 ENCOUNTER — Other Ambulatory Visit (HOSPITAL_COMMUNITY)
Admission: RE | Admit: 2016-06-01 | Discharge: 2016-06-01 | Disposition: A | Discharge status: Home / Self Care | Payer: BLUE CROSS/BLUE SHIELD | Source: Ambulatory Visit | Attending: Physician Assistant | Admitting: Physician Assistant

## 2016-06-01 VITALS — BP 106/68 | HR 85 | Wt 212.0 lb

## 2016-06-01 DIAGNOSIS — F900 Attention-deficit hyperactivity disorder, predominantly inattentive type: Secondary | ICD-10-CM

## 2016-06-01 DIAGNOSIS — Z1151 Encounter for screening for human papillomavirus (HPV): Secondary | ICD-10-CM | POA: Diagnosis not present

## 2016-06-01 DIAGNOSIS — F39 Unspecified mood [affective] disorder: Secondary | ICD-10-CM

## 2016-06-01 DIAGNOSIS — Z0000 Encounter for general adult medical examination without abnormal findings: Secondary | ICD-10-CM

## 2016-06-01 DIAGNOSIS — Z131 Encounter for screening for diabetes mellitus: Secondary | ICD-10-CM | POA: Diagnosis not present

## 2016-06-01 DIAGNOSIS — Z13220 Encounter for screening for lipoid disorders: Secondary | ICD-10-CM | POA: Diagnosis not present

## 2016-06-01 DIAGNOSIS — N760 Acute vaginitis: Secondary | ICD-10-CM | POA: Diagnosis present

## 2016-06-01 DIAGNOSIS — Z01419 Encounter for gynecological examination (general) (routine) without abnormal findings: Secondary | ICD-10-CM | POA: Diagnosis present

## 2016-06-01 DIAGNOSIS — R4586 Emotional lability: Secondary | ICD-10-CM | POA: Insufficient documentation

## 2016-06-01 DIAGNOSIS — R079 Chest pain, unspecified: Secondary | ICD-10-CM | POA: Insufficient documentation

## 2016-06-01 DIAGNOSIS — F32A Depression, unspecified: Secondary | ICD-10-CM

## 2016-06-01 DIAGNOSIS — R0789 Other chest pain: Secondary | ICD-10-CM | POA: Diagnosis not present

## 2016-06-01 DIAGNOSIS — F411 Generalized anxiety disorder: Secondary | ICD-10-CM

## 2016-06-01 DIAGNOSIS — F329 Major depressive disorder, single episode, unspecified: Secondary | ICD-10-CM

## 2016-06-01 DIAGNOSIS — N943 Premenstrual tension syndrome: Secondary | ICD-10-CM

## 2016-06-01 MED ORDER — GI COCKTAIL ~~LOC~~
30.0000 mL | Freq: Once | ORAL | Status: AC
  Administered 2016-06-01: 30 mL via ORAL

## 2016-06-01 NOTE — Patient Instructions (Signed)

## 2016-06-01 NOTE — Progress Notes (Signed)
Subjective:     Katrina Mcclain is a 40 y.o. female and is here for a comprehensive physical exam. The patient reports concern of her cardiac health. She states there is a family history of hyperlipidemia but she has no personal cardiac history. She inquires about doing a heart CT scan. She also states that since her tubal ligation she has not having a reguular period or cramping but she is concern about her hormone levels. Additional, she requests screening for bipolar disorder. At the end of the interview and exam, patient reports onset of chest pain. She states started 30 minutes before office visit. Pain is off and on but worse with deep breath in. She did admit for lunch eating a chilli cheese dog. No SOB.   Social History   Social History  . Marital Status: Married    Spouse Name: N/A  . Number of Children: N/A  . Years of Education: N/A   Occupational History  . Not on file.   Social History Main Topics  . Smoking status: Never Smoker   . Smokeless tobacco: Never Used  . Alcohol Use: No  . Drug Use: No  . Sexual Activity: Yes    Birth Control/ Protection: Surgical   Other Topics Concern  . Not on file   Social History Narrative   Health Maintenance  Topic Date Due  . PAP SMEAR  10/31/2014  . HIV Screening  06/01/2021 (Originally 02/[birth year]/1990)  . INFLUENZA VACCINE  06/21/2016  . MAMMOGRAM  11/03/2017  . TETANUS/TDAP  10/31/2021    The following portions of the patient's history were reviewed and updated as appropriate: allergies, current medications, past family history, past medical history, past social history, past surgical history and problem list.  Review of Systems Pertinent items are noted in HPI. All other systems are negative   Objective:    General appearance: alert, cooperative, appears stated age and no distress Head: Normocephalic, without obvious abnormality, atraumatic Eyes: conjunctivae/corneas clear. PERRL, EOM's intact. Fundi benign. Ears: normal  TM's and external ear canals both ears Nose: Nares normal. Septum midline. Mucosa normal. No drainage or sinus tenderness. Throat: lips, mucosa, and tongue normal; teeth and gums normal Neck: no adenopathy, no carotid bruit, no JVD, supple, symmetrical, trachea midline and thyroid not enlarged, symmetric, no tenderness/mass/nodules Back: symmetric, no curvature. ROM normal. No CVA tenderness. Lungs: clear to auscultation bilaterally Breasts: normal appearance, no masses or tenderness Heart: regular rate and rhythm, S1, S2 normal, no murmur, click, rub or gallop Abdomen: soft, non-tender; bowel sounds normal; no masses,  no organomegaly Pelvic: cervix normal in appearance, external genitalia normal, no adnexal masses or tenderness, no cervical motion tenderness, rectovaginal septum normal, uterus normal size, shape, and consistency and vagina normal without discharge Extremities: extremities normal, atraumatic, no cyanosis or edema Pulses: 2+ and symmetric Skin: Skin color, texture, turgor normal. No rashes or lesions Lymph nodes: Cervical, supraclavicular, and axillary nodes normal. Neurologic: Grossly normal    Assessment:    Healthy female exam.      Plan:    CPE- pap done today. Declined STD testing.  She request to get mammogram every 2 years. Lipid and cmp ordered. Encouraged vitamin D 800 units and calcium 1500mg  daily.   Mood changes/Depression/Anxiety-  MDQ was 7/13. I would like for her to start treatment of ADHD first and see how focusing better effects mood.  Discussed adding back in SSRI. She has previously been seen by Dr. Annia Friendly downstairs. She does not want to start back today.  Follow up in 4 weeks.  Will check hormones and FSH. Hx of pre-menstrual syndrome and certainly could be effecting mood.   Atypical chest pain-  EKG NSR at 75. No arrthymia. No ST elevation or depression.  Likely indigestion. GI cocktail given in office today.  Consider OTC zantac if needed up to  twice a day.   See After Visit Summary for Counseling Recommendations

## 2016-06-03 LAB — CYTOLOGY - PAP

## 2016-06-06 LAB — CERVICOVAGINAL ANCILLARY ONLY
Bacterial vaginitis: NEGATIVE
Candida vaginitis: NEGATIVE

## 2016-09-23 ENCOUNTER — Ambulatory Visit: Payer: Self-pay | Admitting: Sports Medicine

## 2016-09-26 ENCOUNTER — Encounter: Payer: Self-pay | Admitting: Sports Medicine

## 2016-09-26 ENCOUNTER — Ambulatory Visit (INDEPENDENT_AMBULATORY_CARE_PROVIDER_SITE_OTHER): Payer: BLUE CROSS/BLUE SHIELD | Admitting: Sports Medicine

## 2016-09-26 DIAGNOSIS — M79671 Pain in right foot: Secondary | ICD-10-CM | POA: Diagnosis not present

## 2016-09-26 NOTE — Assessment & Plan Note (Signed)
Persistent pain over the third metatarsal shaft despite cast immobilization sometime ago, she did have a negative x-ray. Never got her MRI. She will proceed with MRI and return to see me to go over results and to make a plan.

## 2016-09-26 NOTE — Progress Notes (Signed)
  Subjective:    CC: Follow-up  HPI: Right foot pain: Persistent over the third metatarsal shaft, sometime ago we recommended cast immobilization which helped her somewhat, she did have persistent pain so we recommended an MRI that she never followed through with. Continues with pain.  Past medical history:  Negative.  See flowsheet/record as well for more information.  Surgical history: Negative.  See flowsheet/record as well for more information.  Family history: Negative.  See flowsheet/record as well for more information.  Social history: Negative.  See flowsheet/record as well for more information.  Allergies, and medications have been entered into the medical record, reviewed, and no changes needed.   Review of Systems: No fevers, chills, night sweats, weight loss, chest pain, or shortness of breath.   Objective:    General: Well Developed, well nourished, and in no acute distress.  Neuro: Alert and oriented x3, extra-ocular muscles intact, sensation grossly intact.  HEENT: Normocephalic, atraumatic, pupils equal round reactive to light, neck supple, no masses, no lymphadenopathy, thyroid nonpalpable.  Skin: Warm and dry, no rashes. Cardiac: Regular rate and rhythm, no murmurs rubs or gallops, no lower extremity edema.  Respiratory: Clear to auscultation bilaterally. Not using accessory muscles, speaking in full sentences. Right Foot: No visible erythema or swelling. Range of motion is full in all directions. Strength is 5/5 in all directions. No hallux valgus. No pes cavus or pes planus. No abnormal callus noted. No pain over the navicular prominence, or base of fifth metatarsal. No tenderness to palpation of the calcaneal insertion of plantar fascia. No pain at the Achilles insertion. No pain over the calcaneal bursa. No pain of the retrocalcaneal bursa. Tender to palpation over the proximal third metatarsal shaft dorsally No hallux rigidus or limitus. No tenderness  palpation over interphalangeal joints. No pain with compression of the metatarsal heads. Neurovascularly intact distally.  Impression and Recommendations:    Right foot pain Persistent pain over the third metatarsal shaft despite cast immobilization sometime ago, she did have a negative x-ray. Never got her MRI. She will proceed with MRI and return to see me to go over results and to make a plan.  I spent 25 minutes with this patient, greater than 50% was face-to-face time counseling regarding the above diagnoses

## 2016-09-29 ENCOUNTER — Other Ambulatory Visit: Payer: Self-pay

## 2016-10-03 ENCOUNTER — Ambulatory Visit (INDEPENDENT_AMBULATORY_CARE_PROVIDER_SITE_OTHER): Payer: BLUE CROSS/BLUE SHIELD

## 2016-10-03 DIAGNOSIS — M85471 Solitary bone cyst, right ankle and foot: Secondary | ICD-10-CM | POA: Diagnosis not present

## 2016-10-04 ENCOUNTER — Encounter: Payer: Self-pay | Admitting: Sports Medicine

## 2016-10-04 ENCOUNTER — Ambulatory Visit (INDEPENDENT_AMBULATORY_CARE_PROVIDER_SITE_OTHER): Payer: BLUE CROSS/BLUE SHIELD | Admitting: Sports Medicine

## 2016-10-04 DIAGNOSIS — M79671 Pain in right foot: Secondary | ICD-10-CM

## 2016-10-04 NOTE — Progress Notes (Signed)
  Subjective:    CC: MRI results  HPI: This is a pleasant 40 year old female who's had a long history of foot pain, she ultimately got an MRI, which the results will be dictated below. Continues to have pain, persistent, localized on the dorsum of the foot and intermittent through the day.  Past medical history:  Negative.  See flowsheet/record as well for more information.  Surgical history: Negative.  See flowsheet/record as well for more information.  Family history: Negative.  See flowsheet/record as well for more information.  Social history: Negative.  See flowsheet/record as well for more information.  Allergies, and medications have been entered into the medical record, reviewed, and no changes needed.   Review of Systems: No fevers, chills, night sweats, weight loss, chest pain, or shortness of breath.   Objective:    General: Well Developed, well nourished, and in no acute distress.  Neuro: Alert and oriented x3, extra-ocular muscles intact, sensation grossly intact.  HEENT: Normocephalic, atraumatic, pupils equal round reactive to light, neck supple, no masses, no lymphadenopathy, thyroid nonpalpable.  Skin: Warm and dry, no rashes. Cardiac: Regular rate and rhythm, no murmurs rubs or gallops, no lower extremity edema.  Respiratory: Clear to auscultation bilaterally. Not using accessory muscles, speaking in full sentences. Right Foot: No visible erythema or swelling. Range of motion is full in all directions. Strength is 5/5 in all directions. No hallux valgus. No pes cavus or pes planus. No abnormal callus noted. No pain over the navicular prominence, or base of fifth metatarsal. No tenderness to palpation of the calcaneal insertion of plantar fascia. No pain at the Achilles insertion. No pain over the calcaneal bursa. No pain of the retrocalcaneal bursa. Tender to palpation over the third metatarsal No hallux rigidus or limitus. No tenderness palpation over  interphalangeal joints. No pain with compression of the metatarsal heads. Neurovascularly intact distally.  MRI shows subchondral cystic changes, likely degenerative at the base of the third metatarsal  Procedure: Real-time Ultrasound Guided Injection of right third metatarsal/lateral cuneiform joint Device: GE Logiq E  Verbal informed consent obtained.  Time-out conducted.  Noted no overlying erythema, induration, or other signs of local infection.  Skin prepped in a sterile fashion.  Local anesthesia: Topical Ethyl chloride.  With sterile technique and under real time ultrasound guidance:  25-gauge needle advanced into joint and 1/2 mL kenalog 40, 1/2 mL lidocaine injected easily. Completed without difficulty  Pain immediately resolved suggesting accurate placement of the medication.  Advised to call if fevers/chills, erythema, induration, drainage, or persistent bleeding.  Images permanently stored and available for review in the ultrasound unit.  Impression: Technically successful ultrasound guided injection.  Impression and Recommendations:    Right foot pain MRI shows cystic changes at the base of the third metatarsal, at the articulation with the lateral cuneiform bone. Based on imaging there does not appear to be any real concern for infection or normal latency. Appears to be predominantly degenerative. Injection placed in the lateral cuneiform/third metatarsal articulation, and patient will return to a postop shoe, she already has the shoe at home.  Return to see me in one month.

## 2016-10-04 NOTE — Assessment & Plan Note (Signed)
MRI shows cystic changes at the base of the third metatarsal, at the articulation with the lateral cuneiform bone. Based on imaging there does not appear to be any real concern for infection or normal latency. Appears to be predominantly degenerative. Injection placed in the lateral cuneiform/third metatarsal articulation, and patient will return to a postop shoe, she already has the shoe at home.  Return to see me in one month.

## 2017-01-24 ENCOUNTER — Ambulatory Visit (INDEPENDENT_AMBULATORY_CARE_PROVIDER_SITE_OTHER): Payer: BLUE CROSS/BLUE SHIELD | Admitting: Physician Assistant

## 2017-01-24 ENCOUNTER — Encounter: Payer: Self-pay | Admitting: Physician Assistant

## 2017-01-24 VITALS — BP 110/67 | HR 80 | Ht 66.0 in | Wt 213.0 lb

## 2017-01-24 DIAGNOSIS — N3000 Acute cystitis without hematuria: Secondary | ICD-10-CM

## 2017-01-24 DIAGNOSIS — R300 Dysuria: Secondary | ICD-10-CM

## 2017-01-24 LAB — POCT URINALYSIS DIPSTICK
BILIRUBIN UA: NEGATIVE
Glucose, UA: 100
Ketones, UA: NEGATIVE
Nitrite, UA: POSITIVE
Protein, UA: NEGATIVE
RBC UA: NEGATIVE
Spec Grav, UA: 1.015
Urobilinogen, UA: 1
pH, UA: 7

## 2017-01-24 MED ORDER — FLUCONAZOLE 150 MG PO TABS: 150.0000 mg | ORAL_TABLET | Freq: Once | ORAL | 0 refills | Status: AC

## 2017-01-24 MED ORDER — CIPROFLOXACIN HCL 500 MG PO TABS: 500.0000 mg | ORAL_TABLET | Freq: Two times a day (BID) | ORAL | 0 refills | Status: DC

## 2017-01-24 MED ORDER — PHENAZOPYRIDINE HCL 200 MG PO TABS: 200.0000 mg | ORAL_TABLET | Freq: Three times a day (TID) | ORAL | 1 refills | Status: AC

## 2017-01-24 NOTE — Patient Instructions (Signed)

## 2017-01-24 NOTE — Progress Notes (Signed)
   Subjective:    Patient ID: Katrina Mcclain, female    DOB: Jan 13, 1976, 41 y.o.   MRN: [id number]  HPI  Pt is a 41 yo female who presents to the clinic with dysuria and urinary frequency for 3 days. She denies any fever, chills, n/v/d. She has increased her fluids and taking AZO with little relief. She denies any abdominal pain or flank pain. Hx of UTI after intercourse.    Review of Systems See HPI.     Objective:   Physical Exam  Constitutional: She is oriented to person, place, and time. She appears well-developed and well-nourished.  HENT:  Head: Normocephalic and atraumatic.  Eyes: Conjunctivae are normal.  Neck: Normal range of motion. Neck supple.  Cardiovascular: Normal rate, regular rhythm and normal heart sounds.   Pulmonary/Chest: Effort normal and breath sounds normal. She has no wheezes.  Neurological: She is alert and oriented to person, place, and time.  Psychiatric: She has a normal mood and affect. Her behavior is normal.          Assessment & Plan:  Marland KitchenMarland KitchenLilac was seen today for dysuria.  Diagnoses and all orders for this visit:  Acute cystitis without hematuria -     ciprofloxacin (CIPRO) 500 MG tablet; Take 1 tablet (500 mg total) by mouth 2 (two) times daily. For 3 days. -     phenazopyridine (PYRIDIUM) 200 MG tablet; Take 1 tablet (200 mg total) by mouth 3 (three) times daily.  Dysuria -     POCT urinalysis dipstick -     Urine Culture -     phenazopyridine (PYRIDIUM) 200 MG tablet; Take 1 tablet (200 mg total) by mouth 3 (three) times daily.  Other orders -     fluconazole (DIFLUCAN) 150 MG tablet; Take 1 tablet (150 mg total) by mouth once. Repeat if symptoms persist in 48-72 hours.  .. Results for orders placed or performed in visit on 01/24/17  POCT urinalysis dipstick  Result Value Ref Range   Color, UA orange    Clarity, UA slightly cloudy    Glucose, UA 100    Bilirubin, UA neg    Ketones, UA neg    Spec Grav, UA 1.015    Blood, UA neg    pH, UA 7.0    Protein, UA neg    Urobilinogen, UA 1.0    Nitrite, UA pos    Leukocytes, UA Trace (A) Negative   Will send for culture.  Sent cipro and diflucan for hx of yeast infection after abx.  Refilled pyridium.  Gave HO. Call with any worsening symptoms.

## 2017-01-27 LAB — URINE CULTURE

## 2017-06-19 ENCOUNTER — Ambulatory Visit (INDEPENDENT_AMBULATORY_CARE_PROVIDER_SITE_OTHER): Payer: BLUE CROSS/BLUE SHIELD | Admitting: Osteopathic Medicine

## 2017-06-19 ENCOUNTER — Encounter: Payer: Self-pay | Admitting: Osteopathic Medicine

## 2017-06-19 VITALS — BP 114/78 | HR 79 | Temp 97.7°F | Ht 66.0 in | Wt 214.0 lb

## 2017-06-19 DIAGNOSIS — R300 Dysuria: Secondary | ICD-10-CM | POA: Diagnosis not present

## 2017-06-19 DIAGNOSIS — N3000 Acute cystitis without hematuria: Secondary | ICD-10-CM | POA: Diagnosis not present

## 2017-06-19 LAB — POCT URINALYSIS DIPSTICK
BILIRUBIN UA: NEGATIVE
GLUCOSE UA: NEGATIVE
Ketones, UA: NEGATIVE
NITRITE UA: NEGATIVE
Protein, UA: NEGATIVE
Spec Grav, UA: 1.02 (ref 1.010–1.025)
Urobilinogen, UA: 0.2 E.U./dL
pH, UA: 7.5 (ref 5.0–8.0)

## 2017-06-19 MED ORDER — CIPROFLOXACIN HCL 500 MG PO TABS: 500.0000 mg | ORAL_TABLET | Freq: Two times a day (BID) | ORAL | 0 refills | Status: DC

## 2017-06-19 MED ORDER — PHENAZOPYRIDINE HCL 95 MG PO TABS: 95.0000 mg | ORAL_TABLET | Freq: Three times a day (TID) | ORAL | 0 refills | Status: DC | PRN

## 2017-06-19 NOTE — Progress Notes (Signed)
HPI: Katrina Mcclain is a 41 y.o. female  who presents to [facility] today, 06/19/17,  for chief complaint of:  Chief Complaint  Patient presents with  . Dysuria     Dysuria and urinary urgency/frequency for 4-5 days   Previous UCx (+)Ecoli (R) ampicillin and TMP/SMX.     Past medical history, surgical history, social history and family history reviewed.  Patient Active Problem List   Diagnosis Date Noted  . Mood changes ([place]) 06/01/2016  . Chest pain 06/01/2016  . Sleeping difficulties 04/20/2016  . UTI (urinary tract infection) 08/31/2015  . Right foot pain 02/03/2015  . Breast pain, right 12/02/2014  . Family hx-breast malignancy 12/02/2014  . Melanocytic nevi of face 04/11/2014  . Headache(784.0) 03/12/2014  . Atypical nevus of face 03/12/2014  . Fluttering sensation of heart 03/12/2014  . Insomnia 12/04/2013  . Thoracic sprain and strain 04/26/2012  . Menstrual bleeding problem 11/01/2011  . Hyperlipidemia 11/01/2011  . Family history of breast cancer in female 11/01/2011  . Anxiety disorder 08/30/2011  . Depression 08/30/2011  . PMS (premenstrual syndrome) 08/30/2011  . ADHD (attention deficit hyperactivity disorder) 08/30/2011    Current medication list and allergy/intolerance information reviewed.   No current outpatient prescriptions on file prior to visit.   No current facility-administered medications on file prior to visit.    Allergies  Allergen Reactions  . Wellbutrin [Bupropion] Hives      Review of Systems:  Constitutional: No recent illness, no fever/chills  Gastrointestinal: No  abdominal pain, no change on bowel habits, no nausea  Musculoskeletal: No new myalgia/arthralgia, no back pain   Exam:  BP 114/78   Pulse 79   Temp 97.7 F (36.5 C) (Oral)   Ht 5\' 6"  (1.676 m)   Wt 214 lb (97.1 kg)   BMI 34.54 kg/m   Constitutional: VS see above. General Appearance: alert, well-developed, well-nourished,  NAD  Neck: No masses, trachea midline.   Respiratory: Normal respiratory effort.   Musculoskeletal: Gait normal.  Neurological: Normal balance/coordination.   Skin: warm, dry, intact.   Psychiatric: Normal judgment/insight. Normal mood and affect. Oriented x3.    Recent Results (from the past 2160 hour(s))  POCT Urinalysis Dipstick     Status: Abnormal   Collection Time: 06/19/17 11:19 AM  Result Value Ref Range   Color, UA light yellow    Clarity, UA cloudy    Glucose, UA negative    Bilirubin, UA negative    Ketones, UA negative    Spec Grav, UA 1.020 1.010 - 1.025   Blood, UA trace    pH, UA 7.5 5.0 - 8.0   Protein, UA negative    Urobilinogen, UA 0.2 0.2 or 1.0 E.U./dL   Nitrite, UA negative    Leukocytes, UA Moderate (2+) (A) Negative    ASSESSMENT/PLAN:   Dysuria - Plan: POCT Urinalysis Dipstick, Urine Culture  Acute cystitis without hematuria - Plan: ciprofloxacin (CIPRO) 500 MG tablet, phenazopyridine (PYRIDIUM) 95 MG tablet     Follow-up plan: Return for [place] when Luvenia Starch is back! .  Visit summary with medication list and pertinent instructions was printed for patient to review, alert [place] if any changes needed. All questions at time of visit were answered - patient instructed to contact office with any additional concerns. ER/RTC precautions were reviewed with the patient and understanding verbalized.

## 2017-06-21 LAB — URINE CULTURE

## 2017-06-22 ENCOUNTER — Other Ambulatory Visit: Payer: Self-pay | Admitting: Osteopathic Medicine

## 2017-06-22 MED ORDER — FLUCONAZOLE 150 MG PO TABS: ORAL_TABLET | ORAL | 0 refills | Status: DC

## 2017-06-26 ENCOUNTER — Encounter: Payer: Self-pay | Admitting: Family Medicine

## 2017-06-26 ENCOUNTER — Ambulatory Visit (INDEPENDENT_AMBULATORY_CARE_PROVIDER_SITE_OTHER): Payer: BLUE CROSS/BLUE SHIELD | Admitting: Family Medicine

## 2017-06-26 VITALS — BP 116/53 | HR 61 | Temp 98.2°F

## 2017-06-26 DIAGNOSIS — R21 Rash and other nonspecific skin eruption: Secondary | ICD-10-CM

## 2017-06-26 DIAGNOSIS — R350 Frequency of micturition: Secondary | ICD-10-CM | POA: Diagnosis not present

## 2017-06-26 DIAGNOSIS — R300 Dysuria: Secondary | ICD-10-CM | POA: Diagnosis not present

## 2017-06-26 LAB — POCT URINALYSIS DIPSTICK
BILIRUBIN UA: NEGATIVE
GLUCOSE UA: NEGATIVE
Ketones, UA: NEGATIVE
Leukocytes, UA: NEGATIVE
NITRITE UA: NEGATIVE
Protein, UA: NEGATIVE
RBC UA: NEGATIVE
Spec Grav, UA: 1.015 (ref 1.010–1.025)
Urobilinogen, UA: 0.2 E.U./dL
pH, UA: 7 (ref 5.0–8.0)

## 2017-06-26 NOTE — Addendum Note (Signed)
Addended by: Huel Cote on: 06/26/2017 04:01 PM   Modules accepted: Orders

## 2017-06-26 NOTE — Progress Notes (Addendum)
   Subjective:    Patient ID: Katrina Mcclain, female    DOB: [birth year], 41 y.o.   MRN: [id number]  HPI 41 year old female comes in today to follow-up for recent urinary tract infection. Check she came in on July 30 and was seen for dysuria and urinary frequency that she is [name]. Thing for about 5 days at that point. She did start the antibiotics, ciprofloxacin twice a day. She says normally when she's had urinary tract infections in the past antibiotics are working very quickly. This time it took a lot longer for her symptoms to start improved. She still been having some mild urgency but no dysuria and no hematuria. No nausea or back pain. She did take a Diflucan on Friday, proximally 3 days ago for which she thought might be some yeast infection symptoms that she was getting a little vaginal irritation. She is feeling a little better today.  She also wanted me to look at a spot on her right upper back that her massage therapist noticed. She has not had any irritation or discomfort. She says it's more of a hyperpigmented larger lesion not a mole.  Review of Systems     Objective:   Physical Exam  Constitutional: She is oriented to person, place, and time. She appears well-developed and well-nourished.  HENT:  Head: Normocephalic and atraumatic.  Eyes: Conjunctivae and EOM are normal.  Cardiovascular: Normal rate.   Pulmonary/Chest: Effort normal.  Neurological: She is alert and oriented to person, place, and time.  Skin: Skin is dry. No pallor.  Psychiatric: She has a normal mood and affect. Her behavior is normal.  Vitals reviewed.   On her right upper back she had a somewhat raised thickened just slightly hyperpigmented oval-shaped area.      Assessment & Plan:  Recent urinary tract infection-repeat urinalysis today is negative but we will send for culture just to confirm. Also explained that it can take Diflucan 2-3 days to really start working and approved to be effective.  Will call her back with the urine results she can let us know if her symptoms have completely resolved or not. Continue drinking plenty of water and stay hydrated.  Rash-most consistent with tinea versicolor. KOH collected. We'll call with results once available.

## 2017-06-27 LAB — URINE CULTURE

## 2017-06-28 LAB — FUNGAL STAIN

## 2017-06-29 MED ORDER — KETOCONAZOLE 2 % EX CREA: 1.0000 "application " | TOPICAL_CREAM | Freq: Two times a day (BID) | CUTANEOUS | 0 refills | Status: AC

## 2017-06-29 NOTE — Addendum Note (Signed)
Addended by: Beatrice Lecher D on: 06/29/2017 08:00 AM   Modules accepted: Orders

## 2017-06-30 ENCOUNTER — Other Ambulatory Visit: Payer: Self-pay | Admitting: Physician Assistant

## 2017-06-30 DIAGNOSIS — R300 Dysuria: Secondary | ICD-10-CM

## 2017-06-30 NOTE — Progress Notes (Signed)
Order placed per lab result. Pt advised.

## 2017-12-18 ENCOUNTER — Encounter: Payer: Self-pay | Admitting: Physician Assistant

## 2017-12-18 ENCOUNTER — Ambulatory Visit: Payer: Self-pay | Admitting: Physician Assistant

## 2017-12-18 VITALS — BP 120/76 | HR 81 | Ht 65.98 in | Wt 212.0 lb

## 2017-12-18 DIAGNOSIS — K921 Melena: Secondary | ICD-10-CM

## 2017-12-18 DIAGNOSIS — F419 Anxiety disorder, unspecified: Secondary | ICD-10-CM

## 2017-12-18 DIAGNOSIS — Z1239 Encounter for other screening for malignant neoplasm of breast: Secondary | ICD-10-CM

## 2017-12-18 DIAGNOSIS — Z1231 Encounter for screening mammogram for malignant neoplasm of breast: Secondary | ICD-10-CM

## 2017-12-18 DIAGNOSIS — Z23 Encounter for immunization: Secondary | ICD-10-CM

## 2017-12-18 MED ORDER — CLONAZEPAM 0.5 MG PO TABS: 0.5000 mg | ORAL_TABLET | Freq: Every day | ORAL | 5 refills | Status: DC

## 2017-12-18 MED ORDER — HYDROCORTISONE ACETATE 25 MG RE SUPP: 25.0000 mg | Freq: Two times a day (BID) | RECTAL | 2 refills | Status: DC | PRN

## 2017-12-18 NOTE — Progress Notes (Signed)
Subjective:    Patient ID: Nyoka Alcoser, female    DOB: [birth year], 42 y.o.   MRN: [id number]  HPI  Pt is a 42 yo female who presents to the clinic c/o blood in her stool over the weekend. She admits she was sitting all day Saturday. Bowel movements have been painful but no straining. Blood was bright red and mostly noted on toilet paper. She has tried some prepration H OTC with some improvement. She no long feels any bulge in rectal area. She has regular bowel movements that she feels like are not hard.   On the way out the door she request rx for klonapin. She hasn't had in a while and finds she needs it prn for anxiety/worry surrounding work.   .. Active Ambulatory Problems    Diagnosis Date Noted  . Anxiety disorder 08/30/2011  . Depression 08/30/2011  . PMS (premenstrual syndrome) 08/30/2011  . ADHD (attention deficit hyperactivity disorder) 08/30/2011  . Menstrual bleeding problem 11/01/2011  . Hyperlipidemia 11/01/2011  . Family history of breast cancer in female 11/01/2011  . Thoracic sprain and strain 04/26/2012  . Insomnia 12/04/2013  . Headache(784.0) 03/12/2014  . Atypical nevus of face 03/12/2014  . Fluttering sensation of heart 03/12/2014  . Melanocytic nevi of face 04/11/2014  . Breast pain, right 12/02/2014  . Family hx-breast malignancy 12/02/2014  . Right foot pain 02/03/2015  . UTI (urinary tract infection) 08/31/2015  . Sleeping difficulties 04/20/2016  . Mood changes 06/01/2016  . Chest pain 06/01/2016   Resolved Ambulatory Problems    Diagnosis Date Noted  . Skin lesion of face 11/01/2011  . Left foot pain 04/13/2012   Past Medical History:  Diagnosis Date  . Anxiety   . Depression   . Headache        Review of Systems  All other systems reviewed and are negative.      Objective:   Physical Exam  Constitutional: She is oriented to person, place, and time. She appears well-developed and well-nourished.  HENT:  Head: Normocephalic and  atraumatic.  Cardiovascular: Normal rate, regular rhythm and normal heart sounds.  Pulmonary/Chest: Effort normal and breath sounds normal.  Abdominal: Soft. Bowel sounds are normal. She exhibits no distension and no mass. There is no tenderness. There is no rebound and no guarding.  Genitourinary: Rectal exam shows guaiac negative stool.  Genitourinary Comments: On inspection no external hemorrhoids. There does appear to be 3 tiny skin tags around anus. No fissures. No pain on DRE.   hemoccult negative.   Neurological: She is alert and oriented to person, place, and time.  Psychiatric: She has a normal mood and affect. Her behavior is normal.          Assessment & Plan:  Marland KitchenMarland KitchenIlissa was seen today for rectal bleeding.  Diagnoses and all orders for this visit:  Blood in stool -     hydrocortisone (ANUSOL-HC) 25 MG suppository; Place 1 suppository (25 mg total) rectally 2 (two) times daily as needed for hemorrhoids.  Influenza vaccine needed -     Flu Vaccine QUAD 6+ mos PF IM (Fluarix Quad PF)  Breast cancer screening -     MM SCREENING BREAST TOMO BILATERAL  Anxiety -     clonazePAM (KLONOPIN) 0.5 MG tablet; Take 1 tablet (0.5 mg total) by mouth at bedtime.   Discussed with patient likely internal hemorrhoids. Given suppository to use as needed. Discussed prevention with not straining, sitting for long periods of time, and preventing hard  stools. Consider stool softener.  No blood on exam today. Follow up if symptoms persist.   Pt asked as going out the door about re prescribing clonazepam as for as needed anxiety. She is in a management position and finds it hard to unwind and relax at night. I refilled today but discussed we needed to have visit to consider other options for coping with anxiety/stress as well and use as needed.

## 2017-12-18 NOTE — Patient Instructions (Signed)
Hemorrhoids Hemorrhoids are swollen veins in and around the rectum or anus. There are two types of hemorrhoids:  Internal hemorrhoids. These occur in the veins that are just inside the rectum. They may poke through to the outside and become irritated and painful.  External hemorrhoids. These occur in the veins that are outside of the anus and can be felt as a painful swelling or hard lump near the anus.  Most hemorrhoids do not cause serious problems, and they can be managed with home treatments such as diet and lifestyle changes. If home treatments do not help your symptoms, procedures can be done to shrink or remove the hemorrhoids. What are the causes? This condition is caused by increased pressure in the anal area. This pressure may result from various things, including:  Constipation.  Straining to have a bowel movement.  Diarrhea.  Pregnancy.  Obesity.  Sitting for long periods of time.  Heavy lifting or other activity that causes you to strain.  Anal sex.  What are the signs or symptoms? Symptoms of this condition include:  Pain.  Anal itching or irritation.  Rectal bleeding.  Leakage of stool (feces).  Anal swelling.  One or more lumps around the anus.  How is this diagnosed? This condition can often be diagnosed through a visual exam. Other exams or tests may also be done, such as:  Examination of the rectal area with a gloved hand (digital rectal exam).  Examination of the anal canal using a small tube (anoscope).  A blood test, if you have lost a significant amount of blood.  A test to look inside the colon (sigmoidoscopy or colonoscopy).  How is this treated? This condition can usually be treated at home. However, various procedures may be done if dietary changes, lifestyle changes, and other home treatments do not help your symptoms. These procedures can help make the hemorrhoids smaller or remove them completely. Some of these procedures involve  surgery, and others do not. Common procedures include:  Rubber band ligation. Rubber bands are placed at the base of the hemorrhoids to cut off the blood supply to them.  Sclerotherapy. Medicine is injected into the hemorrhoids to shrink them.  Infrared coagulation. A type of light energy is used to get rid of the hemorrhoids.  Hemorrhoidectomy surgery. The hemorrhoids are surgically removed, and the veins that supply them are tied off.  Stapled hemorrhoidopexy surgery. A circular stapling device is used to remove the hemorrhoids and use staples to cut off the blood supply to them.  Follow these instructions at home: Eating and drinking  Eat foods that have a lot of fiber in them, such as whole grains, beans, nuts, fruits, and vegetables. Ask your health care provider about taking products that have added fiber (fiber supplements).  Drink enough fluid to keep your urine clear or pale yellow. Managing pain and swelling  Take warm sitz baths for 20 minutes, 3-4 times a day to ease pain and discomfort.  If directed, apply ice to the affected area. Using ice packs between sitz baths may be helpful. ? Put ice in a plastic bag. ? Place a towel between your skin and the bag. ? Leave the ice on for 20 minutes, 2-3 times a day. General instructions  Take over-the-counter and prescription medicines only as told by your health care provider.  Use medicated creams or suppositories as told.  Exercise regularly.  Go to the bathroom when you have the urge to have a bowel movement. Do not wait.    Avoid straining to have bowel movements.  Keep the anal area dry and clean. Use wet toilet paper or moist towelettes after a bowel movement.  Do not sit on the toilet for long periods of time. This increases blood pooling and pain. Contact a health care provider if:  You have increasing pain and swelling that are not controlled by treatment or medicine.  You have uncontrolled bleeding.  You  have difficulty having a bowel movement, or you are unable to have a bowel movement.  You have pain or inflammation outside the area of the hemorrhoids. This information is not intended to replace advice given to you by your health care provider. Make sure you discuss any questions you have with your health care provider. Document Released: 11/04/2000 Document Revised: 04/06/2016 Document Reviewed: 07/22/2015 Elsevier Interactive Patient Education  2018 Elsevier Inc.  

## 2017-12-20 ENCOUNTER — Encounter: Payer: Self-pay | Admitting: Physician Assistant

## 2017-12-20 DIAGNOSIS — K921 Melena: Secondary | ICD-10-CM | POA: Insufficient documentation

## 2017-12-27 ENCOUNTER — Ambulatory Visit (INDEPENDENT_AMBULATORY_CARE_PROVIDER_SITE_OTHER): Payer: BLUE CROSS/BLUE SHIELD

## 2017-12-27 ENCOUNTER — Encounter: Payer: Self-pay | Admitting: Physician Assistant

## 2017-12-27 DIAGNOSIS — N6320 Unspecified lump in the left breast, unspecified quadrant: Secondary | ICD-10-CM | POA: Insufficient documentation

## 2017-12-27 DIAGNOSIS — R928 Other abnormal and inconclusive findings on diagnostic imaging of breast: Secondary | ICD-10-CM

## 2017-12-28 ENCOUNTER — Other Ambulatory Visit: Payer: Self-pay | Admitting: Physician Assistant

## 2017-12-28 DIAGNOSIS — R928 Other abnormal and inconclusive findings on diagnostic imaging of breast: Secondary | ICD-10-CM

## 2018-01-01 ENCOUNTER — Ambulatory Visit: Admission: RE | Admit: 2018-01-01 | Payer: Self-pay | Source: Ambulatory Visit

## 2018-01-01 ENCOUNTER — Ambulatory Visit
Admission: RE | Admit: 2018-01-01 | Discharge: 2018-01-01 | Disposition: A | Discharge status: Home / Self Care | Payer: No Typology Code available for payment source | Source: Ambulatory Visit | Attending: Physician Assistant | Admitting: Physician Assistant

## 2018-01-01 ENCOUNTER — Other Ambulatory Visit: Payer: Self-pay

## 2018-01-01 DIAGNOSIS — R928 Other abnormal and inconclusive findings on diagnostic imaging of breast: Secondary | ICD-10-CM

## 2018-01-30 ENCOUNTER — Ambulatory Visit (INDEPENDENT_AMBULATORY_CARE_PROVIDER_SITE_OTHER): Payer: BLUE CROSS/BLUE SHIELD | Admitting: Sports Medicine

## 2018-01-30 ENCOUNTER — Encounter: Payer: Self-pay | Admitting: Sports Medicine

## 2018-01-30 DIAGNOSIS — M79671 Pain in right foot: Secondary | ICD-10-CM | POA: Diagnosis not present

## 2018-01-30 MED ORDER — TRAMADOL HCL 50 MG PO TABS
ORAL_TABLET | ORAL | 0 refills | Status: DC
Start: 2018-01-30 — End: 2018-07-31

## 2018-01-30 NOTE — Progress Notes (Signed)
Subjective:    CC: Recheck foot pain  HPI: This is a pleasant 41 year old female, I saw her 2 years ago with right foot pain, ultimately we traced to the pain to the tarsometatarsal joint, and injection provided approximately 2 years of relief.  More recently she started to have a recurrence of pain in the same location, mild, persistent, she does however have a wedding coming up in a week and would like the pain gone before then.  I reviewed the past medical history, family history, social history, surgical history, and allergies today and no changes were needed.  Please see the problem list section below in epic for further details.  Past Medical History: Past Medical History:  Diagnosis Date  . Anxiety   . Depression   . Headache    Past Surgical History: Past Surgical History:  Procedure Laterality Date  . CESAREAN SECTION    . CHOLECYSTECTOMY    . TUBAL LIGATION     Social History: Social History   Socioeconomic History  . Marital status: Married    Spouse name: None  . Number of children: None  . Years of education: None  . Highest education level: None  Social Needs  . Financial resource strain: None  . Food insecurity - worry: None  . Food insecurity - inability: None  . Transportation needs - medical: None  . Transportation needs - non-medical: None  Occupational History  . None  Tobacco Use  . Smoking status: Never Smoker  . Smokeless tobacco: Never Used  Substance and Sexual Activity  . Alcohol use: No  . Drug use: No  . Sexual activity: Yes    Birth control/protection: Surgical  Other Topics Concern  . None  Social History Narrative  . None   Family History: Family History  Problem Relation Age of Onset  . Cancer Mother        Breast  . Breast cancer Mother        early 55s  . Ovarian cysts Sister   . OCD Father    Allergies: Allergies  Allergen Reactions  . Wellbutrin [Bupropion] Hives   Medications: See med rec.  Review of Systems:  No fevers, chills, night sweats, weight loss, chest pain, or shortness of breath.   Objective:    General: Well Developed, well nourished, and in no acute distress.  Neuro: Alert and oriented x3, extra-ocular muscles intact, sensation grossly intact.  HEENT: Normocephalic, atraumatic, pupils equal round reactive to light, neck supple, no masses, no lymphadenopathy, thyroid nonpalpable.  Skin: Warm and dry, no rashes. Cardiac: Regular rate and rhythm, no murmurs rubs or gallops, no lower extremity edema.  Respiratory: Clear to auscultation bilaterally. Not using accessory muscles, speaking in full sentences. Right foot: No visible erythema or swelling. Range of motion is full in all directions. Strength is 5/5 in all directions. No hallux valgus. No pes cavus or pes planus. No abnormal callus noted. No pain over the navicular prominence, or base of fifth metatarsal. No tenderness to palpation of the calcaneal insertion of plantar fascia. No pain at the Achilles insertion. No pain over the calcaneal bursa. No pain of the retrocalcaneal bursa. Tender to palpation at the lateral cuneiform/third metatarsal joint No hallux rigidus or limitus. No tenderness palpation over interphalangeal joints. No pain with compression of the metatarsal heads. Neurovascularly intact distally.  Procedure: Real-time Ultrasound Guided Injection of right lateral cuneiform/third metatarsal joint 1/2 cc kenalog 40, 1/2 cc lidocaine injected easily Device: GE Logiq E  Verbal  informed consent obtained.  Time-out conducted.  Noted no overlying erythema, induration, or other signs of local infection.  Skin prepped in a sterile fashion.  Local anesthesia: Topical Ethyl chloride.  With sterile technique and under real time ultrasound guidance:   Completed without difficulty  Pain immediately resolved suggesting accurate placement of the medication.  Advised to call if fevers/chills, erythema, induration,  drainage, or persistent bleeding.  Images permanently stored and available for review in the ultrasound unit.  Impression: Technically successful ultrasound guided injection.  Impression and Recommendations:    Right foot pain MRI did show edema and T2 signal at the base of the third metatarsal at the articulation with the lateral cuneiform. This was injected 2 years ago with good relief, return of pain, reinjected today. Tramadol for breakthrough pain, return for custom molded orthotics. She does have a wedding coming up.   Needs to be able to break it down on the dance floor. When she returns we can discuss her back pain further. ___________________________________________ Gwen Her. Dianah Field, M.D., ABFM., CAQSM. Primary Care and [place] Instructor of [place] [employer] [facility] [employer]

## 2018-01-30 NOTE — Assessment & Plan Note (Signed)
MRI did show edema and T2 signal at the base of the third metatarsal at the articulation with the lateral cuneiform. This was injected 2 years ago with good relief, return of pain, reinjected today. Tramadol for breakthrough pain, return for custom molded orthotics. She does have a wedding coming up.   Needs to be able to break it down on the dance floor. When she returns we can discuss her back pain further.

## 2018-03-12 ENCOUNTER — Ambulatory Visit (INDEPENDENT_AMBULATORY_CARE_PROVIDER_SITE_OTHER): Payer: BLUE CROSS/BLUE SHIELD | Admitting: Osteopathic Medicine

## 2018-03-12 ENCOUNTER — Encounter: Payer: Self-pay | Admitting: Osteopathic Medicine

## 2018-03-12 VITALS — BP 119/78 | HR 73 | Temp 98.1°F | Wt 216.6 lb

## 2018-03-12 DIAGNOSIS — F5101 Primary insomnia: Secondary | ICD-10-CM | POA: Diagnosis not present

## 2018-03-12 DIAGNOSIS — Z13220 Encounter for screening for lipoid disorders: Secondary | ICD-10-CM | POA: Diagnosis not present

## 2018-03-12 DIAGNOSIS — R5382 Chronic fatigue, unspecified: Secondary | ICD-10-CM | POA: Diagnosis not present

## 2018-03-12 DIAGNOSIS — R454 Irritability and anger: Secondary | ICD-10-CM

## 2018-03-12 DIAGNOSIS — N912 Amenorrhea, unspecified: Secondary | ICD-10-CM

## 2018-03-12 NOTE — Progress Notes (Signed)
HPI: Katrina Mcclain is a 42 y.o. female who  has a past medical history of Anxiety, Depression, and Headache.  she presents to [facility] today, 03/12/18,  for chief complaint of: "Not feeling right for a few months"  More irritable, having issues with insomnia, fatigue. Hx endometrial ablation, cannot say for sure her cycles as she doesn't have periods anymore, but is concerned about perimenopause and requests hormone levels checked. She is not particularly concerned about underlying anxiety/depression, she noted hx adult ADHD which she was previously medicated for but didn't like how the medications made her feel as far as increasing her anxiety. Passing thoughts of death, no SI.   Depression screen [facility] 2/9 03/12/2018 12/18/2017  Decreased Interest 1 0  Down, Depressed, Hopeless 2 0  PHQ - 2 Score 3 0  Altered sleeping 2 1  Tired, decreased energy 2 1  Change in appetite 1 0  Feeling bad or failure about yourself  2 -  Trouble concentrating 0 -  Moving slowly or fidgety/restless 2 0  Suicidal thoughts 1 -  PHQ-9 Score 13 2  Difficult doing work/chores Somewhat difficult -   GAD 7 : Generalized Anxiety Score 03/12/2018 12/18/2017  Nervous, Anxious, on Edge 2 1  Control/stop worrying 2 2  Worry too much - different things 2 -  Trouble relaxing 2 1  Restless 2 1  Easily annoyed or irritable 3 1  Afraid - awful might happen 0 0  Total GAD 7 Score 13 -  Anxiety Difficulty Somewhat difficult Somewhat difficult     Past medical, surgical, social and family history reviewed:  Patient Active Problem List   Diagnosis Date Noted  . Left breast mass 12/27/2017  . Blood in stool 12/20/2017  . Mood changes 06/01/2016  . Chest pain 06/01/2016  . Sleeping difficulties 04/20/2016  . UTI (urinary tract infection) 08/31/2015  . Right foot pain 02/03/2015  . Breast pain, right 12/02/2014  . Family hx-breast malignancy 12/02/2014  . Melanocytic nevi of face  04/11/2014  . Headache(784.0) 03/12/2014  . Atypical nevus of face 03/12/2014  . Fluttering sensation of heart 03/12/2014  . Insomnia 12/04/2013  . Thoracic sprain and strain 04/26/2012  . Menstrual bleeding problem 11/01/2011  . Hyperlipidemia 11/01/2011  . Family history of breast cancer in female 11/01/2011  . Anxiety disorder 08/30/2011  . Depression 08/30/2011  . PMS (premenstrual syndrome) 08/30/2011  . ADHD (attention deficit hyperactivity disorder) 08/30/2011    Past Surgical History:  Procedure Laterality Date  . CESAREAN SECTION    . CHOLECYSTECTOMY    . TUBAL LIGATION      Social History   Tobacco Use  . Smoking status: Never Smoker  . Smokeless tobacco: Never Used  Substance Use Topics  . Alcohol use: No    Family History  Problem Relation Age of Onset  . Cancer Mother        Breast  . Breast cancer Mother        early 33s  . Ovarian cysts Sister   . OCD Father      Current medication list and allergy/intolerance information reviewed:    Current Outpatient Medications  Medication Sig Dispense Refill  . clonazePAM (KLONOPIN) 0.5 MG tablet Take 1 tablet (0.5 mg total) by mouth at bedtime. 30 tablet 5  . ketoconazole (NIZORAL) 2 % cream Apply 1 application topically 2 (two) times daily. X 3 weeks. 30 g 0  . traMADol (ULTRAM) 50 MG tablet 1 tabs by  mouth Q8 hours, maximum 6 tabs per day. 21 tablet 0  . hydrocortisone (ANUSOL-HC) 25 MG suppository Place 1 suppository (25 mg total) rectally 2 (two) times daily as needed for hemorrhoids. (Patient not taking: Reported on 01/30/2018) 24 suppository 2   No current facility-administered medications for this visit.     Allergies  Allergen Reactions  . Wellbutrin [Bupropion] Hives      Review of Systems:  Constitutional:  No  fever, no chills, No recent illness, No unintentional weight changes. +significant fatigue.   HEENT: No  headache, no vision change  Cardiac: No  chest pain, No  pressure, No  palpitations  Respiratory:  No  shortness of breath. No  Cough  Gastrointestinal: No  abdominal pain, No  nausea  Musculoskeletal: No new myalgia/arthralgia  Skin: No  Rash  Genitourinary: No  abnormal genital bleeding  Endocrine: No cold intolerance,  No heat intolerance.  Neurologic: No  weakness, No  dizziness  Psychiatric: No  concerns with depression, +concerns with anxiety/irritability, +sleep problems, +mood problems  Exam:  BP 119/78 (BP Location: Left Arm, Patient Position: Sitting, Cuff Size: Normal)   Pulse 73   Temp 98.1 F (36.7 C) (Oral)   Wt 216 lb 9.6 oz (98.2 kg)   BMI 34.98 kg/m   Constitutional: VS see above. General Appearance: alert, well-developed, well-nourished, NAD  Eyes: Normal lids and conjunctive, non-icteric sclera  Ears, Nose, Mouth, Throat: MMM, Normal external inspection ears/nares/mouth/lips/gums.  Neck: No masses, trachea midline. No thyroid enlargement. No tenderness/mass appreciated. No lymphadenopathy  Respiratory: Normal respiratory effort. no wheeze, no rhonchi, no rales  Cardiovascular: S1/S2 normal, no murmur, no rub/gallop auscultated. RRR. No lower extremity edema.   Musculoskeletal: Gait normal. No clubbing/cyanosis of digits.   Neurological: Normal balance/coordination. No tremor.  Motor intact and symmetric.    Skin: warm, dry, intact. No rash/ulcer.    Psychiatric: Normal judgment/insight. Normal mood and affect. Oriented x3.      ASSESSMENT/PLAN: The primary encounter diagnosis was Irritability. Diagnoses of Amenorrhea, unspecified, Chronic fatigue, Primary insomnia, and Lipid screening were also pertinent to this visit.   Advised consider antidepressant/anti-anxiety treatment versus hormone replacement therapy depending on results and symptoms in discussion with PCP   RTC or seek emergency care if anxiety/moods become an acute concern prior to scheduled follow-up with PCP  Orders Placed This Encounter   Procedures  . CBC  . COMPLETE METABOLIC PANEL WITH GFR  . Lipid panel  . TSH  . VITAMIN D 25 Hydroxy (Vit-D Deficiency, Fractures)  . Estrogens, total  . FSH/LH      Visit summary with medication list and pertinent instructions was printed for patient to review. All questions at time of visit were answered - patient instructed to contact office with any additional concerns. ER/RTC precautions were reviewed with the patient.   Follow-up plan: Return for recheck with Jade in 1-2 weeks, depending on results. See [place] sooner if needed .   Please note: voice recognition software was used to produce this document, and typos may escape review. Please contact Dr. Sheppard Coil for any needed clarifications.

## 2018-03-16 LAB — CBC
HCT: 39 % (ref 35.0–45.0)
Hemoglobin: 13.5 g/dL (ref 11.7–15.5)
MCH: 31.3 pg (ref 27.0–33.0)
MCHC: 34.6 g/dL (ref 32.0–36.0)
MCV: 90.3 fL (ref 80.0–100.0)
MPV: 9.2 fL (ref 7.5–12.5)
Platelets: 360 10*3/uL (ref 140–400)
RBC: 4.32 10*6/uL (ref 3.80–5.10)
RDW: 12.3 % (ref 11.0–15.0)
WBC: 8.4 10*3/uL (ref 3.8–10.8)

## 2018-03-16 LAB — COMPLETE METABOLIC PANEL WITH GFR
AG RATIO: 1.6 (calc) (ref 1.0–2.5)
ALBUMIN MSPROF: 4.4 g/dL (ref 3.6–5.1)
ALKALINE PHOSPHATASE (APISO): 79 U/L (ref 33–115)
ALT: 19 U/L (ref 6–29)
AST: 14 U/L (ref 10–30)
BILIRUBIN TOTAL: 0.6 mg/dL (ref 0.2–1.2)
BUN: 13 mg/dL (ref 7–25)
CHLORIDE: 103 mmol/L (ref 98–110)
CO2: 28 mmol/L (ref 20–32)
Calcium: 9.3 mg/dL (ref 8.6–10.2)
Creat: 0.63 mg/dL (ref 0.50–1.10)
GFR, EST AFRICAN AMERICAN: 127 mL/min/{1.73_m2} (ref >=60)
GFR, Est Non African American: 110 mL/min/{1.73_m2} (ref >=60)
GLOBULIN: 2.8 g/dL (ref 1.9–3.7)
Glucose, Bld: 102 mg/dL — ABNORMAL HIGH (ref 65–99)
POTASSIUM: 4.2 mmol/L (ref 3.5–5.3)
SODIUM: 139 mmol/L (ref 135–146)
TOTAL PROTEIN: 7.2 g/dL (ref 6.1–8.1)

## 2018-03-16 LAB — LIPID PANEL
Cholesterol: 233 mg/dL — ABNORMAL HIGH (ref <200)
HDL: 49 mg/dL — ABNORMAL LOW (ref >50)
LDL CHOLESTEROL (CALC): 148 mg/dL — AB
Non-HDL Cholesterol (Calc): 184 mg/dL (calc) — ABNORMAL HIGH (ref <130)
Total CHOL/HDL Ratio: 4.8 (calc) (ref <5.0)
Triglycerides: 214 mg/dL — ABNORMAL HIGH (ref <150)

## 2018-03-16 LAB — ESTROGENS, TOTAL: ESTROGEN: 157.5 pg/mL

## 2018-03-16 LAB — VITAMIN D 25 HYDROXY (VIT D DEFICIENCY, FRACTURES): VIT D 25 HYDROXY: 23 ng/mL — AB (ref 30–100)

## 2018-03-16 LAB — TSH: TSH: 1.55 mIU/L

## 2018-03-16 LAB — FSH/LH
FSH: 9 m[IU]/mL
LH: 1.9 m[IU]/mL

## 2018-05-17 ENCOUNTER — Encounter: Payer: Self-pay | Admitting: Physician Assistant

## 2018-05-17 ENCOUNTER — Ambulatory Visit: Payer: BLUE CROSS/BLUE SHIELD | Admitting: Physician Assistant

## 2018-05-17 VITALS — BP 114/78 | HR 87 | Temp 98.3°F | Wt 222.0 lb

## 2018-05-17 DIAGNOSIS — J020 Streptococcal pharyngitis: Secondary | ICD-10-CM | POA: Diagnosis not present

## 2018-05-17 LAB — POCT RAPID STREP A (OFFICE): Rapid Strep A Screen: POSITIVE — AB

## 2018-05-17 MED ORDER — PENICILLIN G BENZATHINE 1200000 UNIT/2ML IM SUSP
1.2000 10*6.[IU] | Freq: Once | INTRAMUSCULAR | Status: AC
  Administered 2018-05-17: 1.2 10*6.[IU] via INTRAMUSCULAR

## 2018-05-17 NOTE — Patient Instructions (Signed)
Strep Throat Strep throat is a bacterial infection of the throat. Your health care provider may call the infection tonsillitis or pharyngitis, depending on whether there is swelling in the tonsils or at the back of the throat. Strep throat is most common during the cold months of the year in children who are 5-43 years of age, but it can happen during any season in people of any age. This infection is spread from person to person (contagious) through coughing, sneezing, or close contact. What are the causes? Strep throat is caused by the bacteria called Streptococcus pyogenes. What increases the risk? This condition is more likely to develop in:  People who spend time in crowded places where the infection can spread easily.  People who have close contact with someone who has strep throat.  What are the signs or symptoms? Symptoms of this condition include:  Fever or chills.  Redness, swelling, or pain in the tonsils or throat.  Pain or difficulty when swallowing.  White or yellow spots on the tonsils or throat.  Swollen, tender glands in the neck or under the jaw.  Red rash all over the body (rare).  How is this diagnosed? This condition is diagnosed by performing a rapid strep test or by taking a swab of your throat (throat culture test). Results from a rapid strep test are usually ready in a few minutes, but throat culture test results are available after one or two days. How is this treated? This condition is treated with antibiotic medicine. Follow these instructions at home: Medicines  Take over-the-counter and prescription medicines only as told by your health care provider.  Take your antibiotic as told by your health care provider. Do not stop taking the antibiotic even if you start to feel better.  Have family members who also have a sore throat or fever tested for strep throat. They may need antibiotics if they have the strep infection. Eating and drinking  Do not  share food, drinking cups, or personal items that could cause the infection to spread to other people.  If swallowing is difficult, try eating soft foods until your sore throat feels better.  Drink enough fluid to keep your urine clear or pale yellow. General instructions  Gargle with a salt-water mixture 3-4 times per day or as needed. To make a salt-water mixture, completely dissolve -1 tsp of salt in 1 cup of warm water.  Make sure that all household members wash their hands well.  Get plenty of rest.  Stay home from school or work until you have been taking antibiotics for 24 hours.  Keep all follow-up visits as told by your health care provider. This is important. Contact a health care provider if:  The glands in your neck continue to get bigger.  You develop a rash, cough, or earache.  You cough up a thick liquid that is green, yellow-brown, or bloody.  You have pain or discomfort that does not get better with medicine.  Your problems seem to be getting worse rather than better.  You have a fever. Get help right away if:  You have new symptoms, such as vomiting, severe headache, stiff or painful neck, chest pain, or shortness of breath.  You have severe throat pain, drooling, or changes in your voice.  You have swelling of the neck, or the skin on the neck becomes red and tender.  You have signs of dehydration, such as fatigue, dry mouth, and decreased urination.  You become increasingly sleepy, or   you cannot wake up completely.  Your joints become red or painful. This information is not intended to replace advice given to you by your health care provider. Make sure you discuss any questions you have with your health care provider. Document Released: 11/04/2000 Document Revised: 07/06/2016 Document Reviewed: 03/02/2015 Elsevier Interactive Patient Education  2018 Elsevier Inc.  

## 2018-05-17 NOTE — Progress Notes (Signed)
HPI:                                                                Katrina Mcclain is a 42 y.o. female who presents to [place] [place] today for sore throat  Sore Throat   This is a new problem. The current episode started in the past 7 days. The problem has been unchanged. The pain is worse on the right side. There has been no fever. The pain is moderate. Associated symptoms include swollen glands. Pertinent negatives include no congestion, coughing, hoarse voice, trouble swallowing or vomiting. She has tried NSAIDs for the symptoms.    Past Medical History:  Diagnosis Date  . Anxiety   . Depression   . Headache    Past Surgical History:  Procedure Laterality Date  . CESAREAN SECTION    . CHOLECYSTECTOMY    . TUBAL LIGATION     Social History   Tobacco Use  . Smoking status: Never Smoker  . Smokeless tobacco: Never Used  Substance Use Topics  . Alcohol use: No   family history includes Breast cancer in her mother; Cancer in her mother; OCD in her father; Ovarian cysts in her sister.    ROS: negative except as noted in the HPI  Medications: Current Outpatient Medications  Medication Sig Dispense Refill  . clonazePAM (KLONOPIN) 0.5 MG tablet Take 1 tablet (0.5 mg total) by mouth at bedtime. 30 tablet 5  . hydrocortisone (ANUSOL-HC) 25 MG suppository Place 1 suppository (25 mg total) rectally 2 (two) times daily as needed for hemorrhoids. 24 suppository 2  . ketoconazole (NIZORAL) 2 % cream Apply 1 application topically 2 (two) times daily. X 3 weeks. 30 g 0  . traMADol (ULTRAM) 50 MG tablet 1 tabs by mouth Q8 hours, maximum 6 tabs per day. 21 tablet 0   No current facility-administered medications for this visit.    Allergies  Allergen Reactions  . Wellbutrin [Bupropion] Hives       Objective:  BP 114/78   Pulse 87   Temp 98.3 F (36.8 C) (Oral)   Wt 222 lb (100.7 kg)   BMI 35.85 kg/m  Gen:  alert, not  ill-appearing, no distress, appropriate for age 62: head normocephalic without obvious abnormality, conjunctiva and cornea clear, right tonsil with swelling and exudate, uvula midline, moist mucous membranes, there is tender tonsillar adenopathy, trachea midline Pulm: Normal work of breathing, normal phonation Neuro: alert and oriented x 3, no tremor MSK: extremities atraumatic, normal gait and station Skin: intact, no rashes on exposed skin, no jaundice, no cyanosis    Results for orders placed or performed in visit on 05/17/18 (from the past 72 hour(s))  POCT rapid strep A     Status: Abnormal   Collection Time: 05/17/18  4:31 PM  Result Value Ref Range   Rapid Strep A Screen Positive (A) Negative   No results found.    Assessment and Plan: 42 y.o. female with   Acute streptococcal pharyngitis - Plan: POCT rapid strep A, penicillin g benzathine (BICILLIN LA) 1200000 UNIT/2ML injection 1.2 Million Units   Patient education and anticipatory guidance given Patient agrees with treatment plan Follow-up as needed if symptoms worsen or fail to improve  Charley E.  Maisie Fus PA-C

## 2018-05-23 ENCOUNTER — Encounter: Payer: Self-pay | Admitting: Physician Assistant

## 2018-05-23 ENCOUNTER — Ambulatory Visit: Payer: BLUE CROSS/BLUE SHIELD | Admitting: Physician Assistant

## 2018-05-23 VITALS — BP 110/63 | HR 79 | Temp 98.2°F | Resp 16 | Ht 66.5 in | Wt 218.0 lb

## 2018-05-23 DIAGNOSIS — F900 Attention-deficit hyperactivity disorder, predominantly inattentive type: Secondary | ICD-10-CM | POA: Diagnosis not present

## 2018-05-23 DIAGNOSIS — R51 Headache: Secondary | ICD-10-CM

## 2018-05-23 DIAGNOSIS — F411 Generalized anxiety disorder: Secondary | ICD-10-CM

## 2018-05-23 DIAGNOSIS — F419 Anxiety disorder, unspecified: Secondary | ICD-10-CM | POA: Diagnosis not present

## 2018-05-23 DIAGNOSIS — K121 Other forms of stomatitis: Secondary | ICD-10-CM

## 2018-05-23 DIAGNOSIS — F5102 Adjustment insomnia: Secondary | ICD-10-CM

## 2018-05-23 DIAGNOSIS — B353 Tinea pedis: Secondary | ICD-10-CM | POA: Diagnosis not present

## 2018-05-23 DIAGNOSIS — R519 Headache, unspecified: Secondary | ICD-10-CM

## 2018-05-23 MED ORDER — ESCITALOPRAM OXALATE 5 MG PO TABS: 5.0000 mg | ORAL_TABLET | Freq: Every day | ORAL | 1 refills | Status: DC

## 2018-05-23 MED ORDER — TRAZODONE HCL 50 MG PO TABS: 50.0000 mg | ORAL_TABLET | Freq: Every day | ORAL | 1 refills | Status: DC

## 2018-05-23 MED ORDER — LISDEXAMFETAMINE DIMESYLATE 10 MG PO CAPS: 10.0000 mg | ORAL_CAPSULE | Freq: Every day | ORAL | 0 refills | Status: DC

## 2018-05-23 MED ORDER — CLONAZEPAM 0.5 MG PO TABS: 0.5000 mg | ORAL_TABLET | Freq: Every day | ORAL | 5 refills | Status: DC

## 2018-05-23 MED ORDER — TRIAMCINOLONE ACETONIDE 0.1 % EX CREA: 1.0000 "application " | TOPICAL_CREAM | Freq: Two times a day (BID) | CUTANEOUS | 0 refills | Status: DC

## 2018-05-23 MED ORDER — TRIAMCINOLONE ACETONIDE 0.1 % MT PSTE: 1.0000 "application " | PASTE | Freq: Three times a day (TID) | OROMUCOSAL | 1 refills | Status: AC

## 2018-05-23 NOTE — Progress Notes (Signed)
Subjective:    Patient ID: Katrina Mcclain, female    DOB: 07-22-1976, 42 y.o.   MRN: [id number]  HPI Pt is a 42 yo female who presents to the clinic for follow up.   Pt feels like anxiety has worsened over the past few months. Her husband is sick and needs his 2nd kidney transplant. He is not able work and putting a lot of financial stress on her. She is using her klonopin more frequently and feels like she needs a daily medication. She feels like it is increasingly hard for her to focus. She has dx of ADHD but not been taking medication due to making her feel to jittery when she takes a stimulant. No SI/HC. She is having a lot of problems sleeping.   Pt is having more headaches recently. She wonders if due to stress. Denies any vision changes, gait changes. Some nausea associated.   She is concerned about diabetes. She would like labs.   She has a painful lesion in her mouth that she would like looked at. It has been present for a few days.she has done nothing to make better.   She has scaly itchy right foot. She wonders about fungus.   She has a tiny irritated bump of left lower groin. She would like it looked at.   .. Active Ambulatory Problems    Diagnosis Date Noted  . Anxiety disorder 08/30/2011  . Depression 08/30/2011  . PMS (premenstrual syndrome) 08/30/2011  . ADHD (attention deficit hyperactivity disorder) 08/30/2011  . Menstrual bleeding problem 11/01/2011  . Hyperlipidemia 11/01/2011  . Family history of breast cancer in female 11/01/2011  . Thoracic sprain and strain 04/26/2012  . Insomnia 12/04/2013  . Headache(784.0) 03/12/2014  . Atypical nevus of face 03/12/2014  . Fluttering sensation of heart 03/12/2014  . Melanocytic nevi of face 04/11/2014  . Breast pain, right 12/02/2014  . Family hx-breast malignancy 12/02/2014  . Right foot pain 02/03/2015  . UTI (urinary tract infection) 08/31/2015  . Sleeping difficulties 04/20/2016  . Mood changes 06/01/2016  .  Chest pain 06/01/2016  . Blood in stool 12/20/2017  . Left breast mass 12/27/2017  . Nonintractable headache 05/29/2018   Resolved Ambulatory Problems    Diagnosis Date Noted  . Skin lesion of face 11/01/2011  . Left foot pain 04/13/2012  . OSA (obstructive sleep apnea) 05/28/2018   Past Medical History:  Diagnosis Date  . Anxiety   . Depression   . Headache       Review of Systems See HPI.     Objective:   Physical Exam  Constitutional: She is oriented to person, place, and time. She appears well-developed and well-nourished.  HENT:  Head: Normocephalic and atraumatic.  Mouth/Throat: Oropharynx is clear and moist.  Eyes: Pupils are equal, round, and reactive to light. EOM are normal.  Ulcer of lower lip.   Neck: Normal range of motion. Neck supple.  Cardiovascular: Normal rate, regular rhythm and normal heart sounds.  Pulmonary/Chest: Effort normal and breath sounds normal.  Neurological: She is alert and oriented to person, place, and time.  Skin:  Right foot slightly inflammed and scaly.   Tiny indurated red papule of left lower groin. No drainage.   Psychiatric: She has a normal mood and affect.          Assessment & Plan:  Marland KitchenMarland KitchenMeher was seen today for follow up anxiety meds, headache and bloodwork.  Diagnoses and all orders for this visit:  Attention deficit hyperactivity disorder (  ADHD), predominantly inattentive type -     lisdexamfetamine (VYVANSE) 10 MG capsule; Take 1 capsule (10 mg total) by mouth daily.  Anxiety -     escitalopram (LEXAPRO) 5 MG tablet; Take 1 tablet (5 mg total) by mouth daily. -     clonazePAM (KLONOPIN) 0.5 MG tablet; Take 1 tablet (0.5 mg total) by mouth at bedtime.  Generalized anxiety disorder -     escitalopram (LEXAPRO) 5 MG tablet; Take 1 tablet (5 mg total) by mouth daily. -     clonazePAM (KLONOPIN) 0.5 MG tablet; Take 1 tablet (0.5 mg total) by mouth at bedtime.  Oral ulcer -     triamcinolone (KENALOG) 0.1 % paste;  Use as directed 1 application in the mouth or throat 3 (three) times daily.  Tinea pedis of right foot -     ketoconazole (NIZORAL) 2 % cream; Apply 1 application topically 2 (two) times daily. To affected areas.  Nonintractable headache, unspecified chronicity pattern, unspecified headache type  Adjustment insomnia -     traZODone (DESYREL) 50 MG tablet; Take 1 tablet (50 mg total) by mouth at bedtime.  Other orders -     triamcinolone cream (KENALOG) 0.1 %; Apply 1 application topically 2 (two) times daily.   .. Depression screen [facility] 2/9 05/23/2018 03/12/2018 12/18/2017  Decreased Interest 1 1 0  Down, Depressed, Hopeless 2 2 0  PHQ - 2 Score 3 3 0  Altered sleeping 2 2 1   Tired, decreased energy 3 2 1   Change in appetite 0 1 0  Feeling bad or failure about yourself  3 2 -  Trouble concentrating 2 0 -  Moving slowly or fidgety/restless 2 2 0  Suicidal thoughts 2 1 -  PHQ-9 Score 17 13 2   Difficult doing work/chores - Somewhat difficult -   .. GAD 7 : Generalized Anxiety Score 05/23/2018 03/12/2018 12/18/2017  Nervous, Anxious, on Edge 2 2 1   Control/stop worrying 1 2 2   Worry too much - different things 1 2 -  Trouble relaxing 3 2 1   Restless 1 2 1   Easily annoyed or irritable 3 3 1   Afraid - awful might happen 1 0 0  Total GAD 7 Score 12 13 -  Anxiety Difficulty - Somewhat difficult Somewhat difficult    Added lexapro.  Added vyvanse to see if lack of focus is effecting anxiety. Pt will pay special attention to see if vyvanse makes anxiety worse.  Trazodone to help with sleeping.  Discussed oral ulcer and treatment with triamcinolone paste.  reassured that bump appears like an ingrown hair. Warm compresses consider triamcinolone if irritating.  Follow up in 2 months.   Marland Kitchen.Spent 30 minutes with patient and greater than 50 percent of visit spent counseling patient regarding treatment plan.

## 2018-05-28 DIAGNOSIS — G4733 Obstructive sleep apnea (adult) (pediatric): Secondary | ICD-10-CM | POA: Insufficient documentation

## 2018-05-29 DIAGNOSIS — R51 Headache: Secondary | ICD-10-CM

## 2018-05-29 DIAGNOSIS — R519 Headache, unspecified: Secondary | ICD-10-CM | POA: Insufficient documentation

## 2018-05-29 MED ORDER — KETOCONAZOLE 2 % EX CREA: 1.0000 "application " | TOPICAL_CREAM | Freq: Two times a day (BID) | CUTANEOUS | 3 refills | Status: AC

## 2018-07-09 ENCOUNTER — Other Ambulatory Visit: Payer: Self-pay

## 2018-07-09 DIAGNOSIS — F419 Anxiety disorder, unspecified: Secondary | ICD-10-CM

## 2018-07-09 DIAGNOSIS — F411 Generalized anxiety disorder: Secondary | ICD-10-CM

## 2018-07-09 MED ORDER — ESCITALOPRAM OXALATE 5 MG PO TABS
5.0000 mg | ORAL_TABLET | Freq: Every day | ORAL | 0 refills | Status: DC
Start: 2018-07-09 — End: 2018-07-31

## 2018-07-11 ENCOUNTER — Encounter: Payer: Self-pay | Admitting: Emergency Medicine

## 2018-07-11 ENCOUNTER — Telehealth: Payer: Self-pay | Admitting: Physician Assistant

## 2018-07-11 ENCOUNTER — Other Ambulatory Visit: Payer: Self-pay

## 2018-07-11 ENCOUNTER — Emergency Department
Admission: EM | Admit: 2018-07-11 | Discharge: 2018-07-11 | Disposition: A | Discharge status: Home / Self Care | Payer: BLUE CROSS/BLUE SHIELD | Source: Home / Self Care | Attending: Emergency Medicine | Admitting: Emergency Medicine

## 2018-07-11 DIAGNOSIS — N611 Abscess of the breast and nipple: Secondary | ICD-10-CM

## 2018-07-11 MED ORDER — DOXYCYCLINE HYCLATE 100 MG PO CAPS: 100.0000 mg | ORAL_CAPSULE | Freq: Two times a day (BID) | ORAL | 0 refills | Status: DC

## 2018-07-11 MED ORDER — MUPIROCIN CALCIUM 2 % EX CREA: 1.0000 "application " | TOPICAL_CREAM | Freq: Two times a day (BID) | CUTANEOUS | 0 refills | Status: DC

## 2018-07-11 NOTE — Discharge Instructions (Signed)
Keep area clean with soap and water. Apply cream 3 times a day. If you have increase in redness please get your antibiotics filled.

## 2018-07-11 NOTE — Telephone Encounter (Signed)
Copied from [place] [id number]. Topic: Quick Communication - See Telephone Encounter >> Jul 11, 2018  1:56 PM Bea Graff, NT wrote: CRM for notification. See Telephone encounter for: 07/11/18. [facility] calling and states they do not have the mupirocin cream (BACTROBAN) 2 % in stock but do have the ointment that is a 22 gram ointment in stock and would like to see if the dr would like that filled. CB#: [phone]

## 2018-07-11 NOTE — ED Triage Notes (Signed)
Patient noticed 'pimple' on upper left breast 2 days ago; tried to evacuate it; now reddened and tender touch.

## 2018-07-11 NOTE — ED Provider Notes (Signed)
[facility]    CSN: [id number] Arrival date & time: 07/11/18  1224     History   Chief Complaint Chief Complaint  Patient presents with  . Recurrent Skin Infections    HPI Katrina Mcclain is a 42 y.o. female.  Patient noticed a bump-like area upper outer left breast 2 days ago. She squeezed the area with return of a small amount of material. Yesterday she noticed it enlarging and so she punctured it again. She is concerned there may be an infection. Her husband is immunocompromised andshe is concerned about her having an infection she could get to him. HPI  Past Medical History:  Diagnosis Date  . Anxiety   . Depression   . Headache     Patient Active Problem List   Diagnosis Date Noted  . Nonintractable headache 05/29/2018  . Left breast mass 12/27/2017  . Blood in stool 12/20/2017  . Mood changes 06/01/2016  . Chest pain 06/01/2016  . Sleeping difficulties 04/20/2016  . UTI (urinary tract infection) 08/31/2015  . Right foot pain 02/03/2015  . Breast pain, right 12/02/2014  . Family hx-breast malignancy 12/02/2014  . Melanocytic nevi of face 04/11/2014  . Headache(784.0) 03/12/2014  . Atypical nevus of face 03/12/2014  . Fluttering sensation of heart 03/12/2014  . Insomnia 12/04/2013  . Thoracic sprain and strain 04/26/2012  . Menstrual bleeding problem 11/01/2011  . Hyperlipidemia 11/01/2011  . Family history of breast cancer in female 11/01/2011  . Anxiety disorder 08/30/2011  . Depression 08/30/2011  . PMS (premenstrual syndrome) 08/30/2011  . ADHD (attention deficit hyperactivity disorder) 08/30/2011    Past Surgical History:  Procedure Laterality Date  . CESAREAN SECTION    . CHOLECYSTECTOMY    . TUBAL LIGATION      OB History   None      Home Medications    Prior to Admission medications   Medication Sig Start Date End Date Taking? Authorizing Provider  clonazePAM (KLONOPIN) 0.5 MG tablet Take 1 tablet (0.5 mg total) by mouth at  bedtime. 05/23/18   Breeback, Royetta Car, PA-C  doxycycline (VIBRAMYCIN) 100 MG capsule Take 1 capsule (100 mg total) by mouth 2 (two) times daily. 07/11/18   Darlyne Russian, MD  escitalopram (LEXAPRO) 5 MG tablet Take 1 tablet (5 mg total) by mouth daily. 07/09/18   Breeback, Royetta Car, PA-C  hydrocortisone (ANUSOL-HC) 25 MG suppository Place 1 suppository (25 mg total) rectally 2 (two) times daily as needed for hemorrhoids. 12/18/17   Breeback, Jade L, PA-C  ketoconazole (NIZORAL) 2 % cream Apply 1 application topically 2 (two) times daily. X 3 weeks. 06/29/17   Hali Marry, MD  ketoconazole (NIZORAL) 2 % cream Apply 1 application topically 2 (two) times daily. To affected areas. 05/29/18   Breeback, Jade L, PA-C  lisdexamfetamine (VYVANSE) 10 MG capsule Take 1 capsule (10 mg total) by mouth daily. 05/23/18   Breeback, Jade L, PA-C  mupirocin cream (BACTROBAN) 2 % Apply 1 application topically 2 (two) times daily. 07/11/18   Darlyne Russian, MD  traMADol (ULTRAM) 50 MG tablet 1 tabs by mouth Q8 hours, maximum 6 tabs per day. 01/30/18   Silverio Decamp, MD  traZODone (DESYREL) 50 MG tablet Take 1 tablet (50 mg total) by mouth at bedtime. 05/23/18   Breeback, Jade L, PA-C  triamcinolone (KENALOG) 0.1 % paste Use as directed 1 application in the mouth or throat 3 (three) times daily. 05/23/18   Donella Stade, PA-C  triamcinolone cream (KENALOG) 0.1 % Apply 1 application topically 2 (two) times daily. 05/23/18   Donella Stade, PA-C    Family History Family History  Problem Relation Age of Onset  . Cancer Mother        Breast  . Breast cancer Mother        early 72s  . Ovarian cysts Sister   . OCD Father     Social History Social History   Tobacco Use  . Smoking status: Never Smoker  . Smokeless tobacco: Never Used  Substance Use Topics  . Alcohol use: No  . Drug use: No     Allergies   Wellbutrin [bupropion]   Review of Systems Review of Systems  Constitutional: Negative.     Skin:       He has a red area upper outer quadrant left breast     Physical Exam Triage Vital Signs ED Triage Vitals  Enc Vitals Group     BP 07/11/18 1240 114/74     Pulse Rate 07/11/18 1240 85     Resp 07/11/18 1240 16     Temp 07/11/18 1240 98.4 F (36.9 C)     Temp src --      SpO2 07/11/18 1240 98 %     Weight 07/11/18 1241 220 lb (99.8 kg)     Height 07/11/18 1241 5' 6.5" (1.689 m)     Head Circumference --      Peak Flow --      Pain Score 07/11/18 1241 2     Pain Loc --      Pain Edu? --      Excl. in [place]? --    No data found.  Updated Vital Signs BP 114/74 (BP Location: Right Arm)   Pulse 85   Temp 98.4 F (36.9 C)   Resp 16   Ht 5' 6.5" (1.689 m)   Wt 99.8 kg   SpO2 98%   BMI 34.98 kg/m   Visual Acuity Right Eye Distance:   Left Eye Distance:   Bilateral Distance:    Right Eye Near:   Left Eye Near:    Bilateral Near:     Physical Exam  Skin:  Examination of the left breast reveals a 1 x 1 cm red area with central punctum. There is a 2 mm scab in this area. This area was unroofed with a small needle and culture was done.     UC Treatments / Results  Labs (all labs ordered are listed, but only abnormal results are displayed) Labs Reviewed  WOUND CULTURE    EKG None  Radiology No results found.  Procedures Procedures (including critical care time)  Medications Ordered in UC Medications - No data to display  Initial Impression / Assessment and Plan / UC Course  I have reviewed the triage vital signs and the nursing notes.  Pertinent labs & imaging results that were available during my care of the patient were reviewed by me and considered in my medical decision making (see chart for details). Wound culture was done. Will treat the area with soap and water cleaning followed by Bactroban. Application twice a day. She was given a prescription for doxycycline if she has progression prior to return of her wound culture     Final  Clinical Impressions(s) / UC Diagnoses   Final diagnoses:  Boil, breast     Discharge Instructions     Keep area clean with soap and water. Apply cream 3  times a day. If you have increase in redness please get your antibiotics filled.    ED Prescriptions    Medication Sig Dispense Auth. Provider   mupirocin cream (BACTROBAN) 2 % Apply 1 application topically 2 (two) times daily. 15 g Darlyne Russian, MD   doxycycline (VIBRAMYCIN) 100 MG capsule Take 1 capsule (100 mg total) by mouth 2 (two) times daily. 20 capsule Darlyne Russian, MD     Controlled Substance Prescriptions [place] Controlled Substance Registry consulted? Not Applicable   Darlyne Russian, MD 07/11/18 1401

## 2018-07-14 LAB — WOUND CULTURE
MICRO NUMBER:: 90997067
RESULT:: NO GROWTH
SPECIMEN QUALITY: ADEQUATE

## 2018-07-15 ENCOUNTER — Telehealth: Payer: Self-pay | Admitting: Emergency Medicine

## 2018-07-31 ENCOUNTER — Encounter: Payer: Self-pay | Admitting: Physician Assistant

## 2018-07-31 ENCOUNTER — Ambulatory Visit (INDEPENDENT_AMBULATORY_CARE_PROVIDER_SITE_OTHER): Payer: BLUE CROSS/BLUE SHIELD

## 2018-07-31 ENCOUNTER — Ambulatory Visit: Payer: BLUE CROSS/BLUE SHIELD | Admitting: Physician Assistant

## 2018-07-31 VITALS — BP 106/66 | HR 89 | Temp 98.6°F | Wt 221.0 lb

## 2018-07-31 DIAGNOSIS — N943 Premenstrual tension syndrome: Secondary | ICD-10-CM | POA: Diagnosis not present

## 2018-07-31 DIAGNOSIS — M79645 Pain in left finger(s): Secondary | ICD-10-CM

## 2018-07-31 DIAGNOSIS — F900 Attention-deficit hyperactivity disorder, predominantly inattentive type: Secondary | ICD-10-CM

## 2018-07-31 DIAGNOSIS — J302 Other seasonal allergic rhinitis: Secondary | ICD-10-CM

## 2018-07-31 DIAGNOSIS — T753XXA Motion sickness, initial encounter: Secondary | ICD-10-CM

## 2018-07-31 DIAGNOSIS — Z803 Family history of malignant neoplasm of breast: Secondary | ICD-10-CM

## 2018-07-31 DIAGNOSIS — F330 Major depressive disorder, recurrent, mild: Secondary | ICD-10-CM

## 2018-07-31 MED ORDER — AMPHETAMINE-DEXTROAMPHET ER 10 MG PO CP24: 10.0000 mg | ORAL_CAPSULE | Freq: Every day | ORAL | 0 refills | Status: DC

## 2018-07-31 MED ORDER — ESCITALOPRAM OXALATE 10 MG PO TABS: ORAL_TABLET | ORAL | 1 refills | Status: DC

## 2018-07-31 MED ORDER — SCOPOLAMINE 1 MG/3DAYS TD PT72: 1.0000 | MEDICATED_PATCH | TRANSDERMAL | 3 refills | Status: AC

## 2018-07-31 MED ORDER — CETIRIZINE HCL 10 MG PO TABS: 10.0000 mg | ORAL_TABLET | Freq: Every day | ORAL | 4 refills | Status: AC

## 2018-07-31 NOTE — Progress Notes (Signed)
Call pt: normal xray. No arthritis noted. Disc space look good. Likely ligament sprain. Treatment plan same as discussed in office.

## 2018-07-31 NOTE — Progress Notes (Signed)
Subjective:    Patient ID: Katrina Mcclain, female    DOB: 1975-12-28, 42 y.o.   MRN: [id number]  HPI  Pt is a 42 yo female with MDD, GAD, ADHD, allergies who presents to the clinic for follow up.   She was recently started on lexapro. She has done wonderful with this. She denies any side effects. She feels much less overwhelmed. Her husbands health is still declining but she is able to manage everything. No SI/HC.   Her focus has improved significantly but she still struggles with tasks.   She request some refills of medications for allergies and motion sickness.   She is having some left thumb pain for last few weeks. She does not remember any trauma. She has a thumb spica she just started wearing.   .. Active Ambulatory Problems    Diagnosis Date Noted  . Anxiety disorder 08/30/2011  . Depression 08/30/2011  . PMS (premenstrual syndrome) 08/30/2011  . ADHD (attention deficit hyperactivity disorder) 08/30/2011  . Menstrual bleeding problem 11/01/2011  . Hyperlipidemia 11/01/2011  . Family history of breast cancer in female 11/01/2011  . Thoracic sprain and strain 04/26/2012  . Insomnia 12/04/2013  . Headache(784.0) 03/12/2014  . Atypical nevus of face 03/12/2014  . Fluttering sensation of heart 03/12/2014  . Melanocytic nevi of face 04/11/2014  . Breast pain, right 12/02/2014  . Family hx-breast malignancy 12/02/2014  . Right foot pain 02/03/2015  . UTI (urinary tract infection) 08/31/2015  . Sleeping difficulties 04/20/2016  . Mood changes 06/01/2016  . Chest pain 06/01/2016  . Blood in stool 12/20/2017  . Left breast mass 12/27/2017  . Nonintractable headache 05/29/2018  . Motion sickness 08/02/2018  . Seasonal allergies 08/02/2018  . Thumb pain, left 08/02/2018   Resolved Ambulatory Problems    Diagnosis Date Noted  . Skin lesion of face 11/01/2011  . Left foot pain 04/13/2012  . OSA (obstructive sleep apnea) 05/28/2018   Past Medical History:  Diagnosis Date   . Anxiety   . Headache      Review of Systems  All other systems reviewed and are negative.      Objective:   Physical Exam  Constitutional: She is oriented to person, place, and time. She appears well-developed and well-nourished.  HENT:  Head: Normocephalic and atraumatic.  Cardiovascular: Normal rate and regular rhythm.  Pulmonary/Chest: Effort normal and breath sounds normal.  Musculoskeletal:  Left thumb: tenderness over IP joint.  Tightness with finkelstein but no tenderness.  Strength of thumb 5/5.   Neurological: She is alert and oriented to person, place, and time.  Psychiatric: She has a normal mood and affect. Her behavior is normal.          Assessment & Plan:  Marland KitchenMarland KitchenRosey was seen today for follow-up.  Diagnoses and all orders for this visit:  Mild episode of recurrent major depressive disorder ([place]) -     escitalopram (LEXAPRO) 10 MG tablet; Take one and one-half tablets daily.  Thumb pain, left -     DG Finger Thumb Left  Family history of breast cancer in female  PMS (premenstrual syndrome) -     escitalopram (LEXAPRO) 10 MG tablet; Take one and one-half tablets daily.  Attention deficit hyperactivity disorder (ADHD), predominantly inattentive type -     amphetamine-dextroamphetamine (ADDERALL XR) 10 MG 24 hr capsule; Take 1 capsule (10 mg total) by mouth daily.  Motion sickness, initial encounter -     scopolamine (TRANSDERM-SCOP, 1.5 MG,) 1 MG/3DAYS; Place 1 patch (  1.5 mg total) onto the skin every 3 (three) days.  Seasonal allergies -     cetirizine (ZYRTEC) 10 MG tablet; Take 1 tablet (10 mg total) by mouth daily.   .. Depression screen [facility] 2/9 07/31/2018 05/23/2018 03/12/2018 12/18/2017  Decreased Interest 0 1 1 0  Down, Depressed, Hopeless 0 2 2 0  PHQ - 2 Score 0 3 3 0  Altered sleeping - 2 2 1   Tired, decreased energy 1 3 2 1   Change in appetite 1 0 1 0  Feeling bad or failure about yourself  1 3 2  -  Trouble concentrating 0 2 0 -   Moving slowly or fidgety/restless 0 2 2 0  Suicidal thoughts 0 2 1 -  PHQ-9 Score - 17 13 2   Difficult doing work/chores Not difficult at all - Somewhat difficult -   .. GAD 7 : Generalized Anxiety Score 07/31/2018 05/23/2018 03/12/2018 12/18/2017  Nervous, Anxious, on Edge 1 2 2 1   Control/stop worrying 0 1 2 2   Worry too much - different things 0 1 2 -  Trouble relaxing 1 3 2 1   Restless 0 1 2 1   Easily annoyed or irritable 1 3 3 1   Afraid - awful might happen 0 1 0 0  Total GAD 7 Score 3 12 13  -  Anxiety Difficulty Not difficult at all - Somewhat difficult Somewhat difficult   Refilled lexapro.  Although focus has improved a lot with lexapro she still struggles some. Will add 10mg  of adderall. Discussed side effects. Follow up in 3 months.   Will get xray of thumb. For now stay in thumb spica as much as can. NSAIDs or can use voltaren gel. Ice when needed. If not improving in 2-3 weeks see Dr. Darene Lamer or Dr. Georgina Snell.

## 2018-08-02 ENCOUNTER — Encounter: Payer: Self-pay | Admitting: Physician Assistant

## 2018-08-02 DIAGNOSIS — M79645 Pain in left finger(s): Secondary | ICD-10-CM | POA: Insufficient documentation

## 2018-08-02 DIAGNOSIS — T753XXA Motion sickness, initial encounter: Secondary | ICD-10-CM | POA: Insufficient documentation

## 2018-08-02 DIAGNOSIS — J302 Other seasonal allergic rhinitis: Secondary | ICD-10-CM | POA: Insufficient documentation

## 2018-08-15 ENCOUNTER — Encounter: Payer: Self-pay | Admitting: Sports Medicine

## 2018-08-16 ENCOUNTER — Telehealth: Payer: Self-pay | Admitting: Physician Assistant

## 2018-08-16 NOTE — Telephone Encounter (Signed)
Call pt: stress echo negative for any inducible ischemia. Good ejection fraction. Good exercise capacity! Looks good.

## 2018-08-17 NOTE — Telephone Encounter (Signed)
Called patient and notified her of results. No further questions or concerns at this time.

## 2018-09-21 ENCOUNTER — Encounter: Payer: Self-pay | Admitting: Physician Assistant

## 2018-09-21 ENCOUNTER — Ambulatory Visit (INDEPENDENT_AMBULATORY_CARE_PROVIDER_SITE_OTHER): Payer: BLUE CROSS/BLUE SHIELD | Admitting: Physician Assistant

## 2018-09-21 VITALS — BP 110/80 | Temp 97.7°F | Ht 66.5 in | Wt 222.0 lb

## 2018-09-21 DIAGNOSIS — F900 Attention-deficit hyperactivity disorder, predominantly inattentive type: Secondary | ICD-10-CM

## 2018-09-21 DIAGNOSIS — Z23 Encounter for immunization: Secondary | ICD-10-CM | POA: Diagnosis not present

## 2018-09-21 DIAGNOSIS — R1031 Right lower quadrant pain: Secondary | ICD-10-CM | POA: Diagnosis not present

## 2018-09-21 LAB — POCT URINALYSIS DIPSTICK
BILIRUBIN UA: NEGATIVE
Blood, UA: NEGATIVE
GLUCOSE UA: NEGATIVE
KETONES UA: NEGATIVE
Leukocytes, UA: NEGATIVE
Nitrite, UA: NEGATIVE
Protein, UA: NEGATIVE
Spec Grav, UA: >=1.03 — AB (ref 1.010–1.025)
UROBILINOGEN UA: 0.2 U/dL
pH, UA: 6 (ref 5.0–8.0)

## 2018-09-21 MED ORDER — AMPHETAMINE-DEXTROAMPHET ER 10 MG PO CP24: 10.0000 mg | ORAL_CAPSULE | Freq: Every day | ORAL | 0 refills | Status: AC

## 2018-09-21 NOTE — Progress Notes (Signed)
Subjective:    Patient ID: Katrina Mcclain, female    DOB: 07/10/1976, 42 y.o.   MRN: [id number]  HPI  Patient is a 42 year old female who presents to the clinic with right lower quadrant pain.  Symptoms started Monday and lasted about 3 days. At its worst the patient reports pain at 4 out of 10 at its worse.  She has been a little nauseated using ginger to control her nausea.  Her bowels have been intermittently loose and yellow color.  She denies any radiation of pain into back.  She denies any dysuria but has had some feeling like she cannot completely empty.  She denies any fever, chills, body aches.  Pain started to improve yesterday and is completely gone today.  She still feels, cramping in her right lower quadrant.  She has no history of ovarian cyst.  She does need her Adderall refill.  She does not take it every day.  She feels well controlled on the current dose.  She denies any increase in anxiety.  She is sleeping well.  She denies any palpitations.  .. Active Ambulatory Problems    Diagnosis Date Noted  . Anxiety disorder 08/30/2011  . Depression 08/30/2011  . PMS (premenstrual syndrome) 08/30/2011  . ADHD (attention deficit hyperactivity disorder) 08/30/2011  . Menstrual bleeding problem 11/01/2011  . Hyperlipidemia 11/01/2011  . Family history of breast cancer in female 11/01/2011  . Thoracic sprain and strain 04/26/2012  . Insomnia 12/04/2013  . Headache(784.0) 03/12/2014  . Atypical nevus of face 03/12/2014  . Fluttering sensation of heart 03/12/2014  . Melanocytic nevi of face 04/11/2014  . Breast pain, right 12/02/2014  . Family hx-breast malignancy 12/02/2014  . Right foot pain 02/03/2015  . Sleeping difficulties 04/20/2016  . Mood changes 06/01/2016  . Chest pain 06/01/2016  . Blood in stool 12/20/2017  . Left breast mass 12/27/2017  . Nonintractable headache 05/29/2018  . Motion sickness 08/02/2018  . Seasonal allergies 08/02/2018  . Thumb pain, left  08/02/2018   Resolved Ambulatory Problems    Diagnosis Date Noted  . Skin lesion of face 11/01/2011  . Left foot pain 04/13/2012  . UTI (urinary tract infection) 08/31/2015  . OSA (obstructive sleep apnea) 05/28/2018   Past Medical History:  Diagnosis Date  . Anxiety   . Headache    .    Review of Systems  All other systems reviewed and are negative.      Objective:   Physical Exam  Constitutional: She is oriented to person, place, and time. She appears well-developed and well-nourished.  HENT:  Head: Normocephalic and atraumatic.  Eyes: Conjunctivae are normal.  Neck: Normal range of motion.  Cardiovascular: Normal rate and regular rhythm.  Pulmonary/Chest: Effort normal and breath sounds normal.  No CVA tenderness.   Abdominal: Soft. Bowel sounds are normal. She exhibits no distension and no mass. There is tenderness. There is no rebound and no guarding.  Very minimal Right lower quadrant discomfort. No guarding or rebound.   Neurological: She is alert and oriented to person, place, and time.  Skin: No rash noted.  Psychiatric: She has a normal mood and affect. Her behavior is normal.          Assessment & Plan:  Marland KitchenMarland KitchenDiagnoses and all orders for this visit:  Right lower quadrant pain -     POCT Urinalysis Dipstick -     Urine Culture  Attention deficit hyperactivity disorder (ADHD), predominantly inattentive type -     amphetamine-dextroamphetamine (  ADDERALL XR) 10 MG 24 hr capsule; Take 1 capsule (10 mg total) by mouth daily. -     amphetamine-dextroamphetamine (ADDERALL XR) 10 MG 24 hr capsule; Take 1 capsule (10 mg total) by mouth daily. -     amphetamine-dextroamphetamine (ADDERALL XR) 10 MG 24 hr capsule; Take 1 capsule (10 mg total) by mouth daily.  Need for immunization against influenza -     Flu Vaccine QUAD 36+ mos IM  .Marland Kitchen Results for orders placed or performed in visit on 09/21/18  POCT Urinalysis Dipstick  Result Value Ref Range   Color, UA  dark yellow    Clarity, UA slightly cloudy    Glucose, UA Negative Negative   Bilirubin, UA negative    Ketones, UA negative    Spec Grav, UA >=1.030 (A) 1.010 - 1.025   Blood, UA negative    pH, UA 6.0 5.0 - 8.0   Protein, UA Negative Negative   Urobilinogen, UA 0.2 0.2 or 1.0 E.U./dL   Nitrite, UA negative    Leukocytes, UA Negative Negative   Appearance     Odor     UA today negative for any signs of urinary tract infection.  Will culture to confirm no infection.  Pain resolved today for the most part. Could have been an ovarian cyst.  Discussed this is likely not something that would recur or bothersome.  She also could have had a stomach virus and caused some abdominal cramping and some loose stools for a few days.  I encouraged her to use a probiotic to restore bowel health.  If this reoccurs she can call we will order a pelvic ultrasound to look for a cyst.  Pain has resolved and she has no signs suggestive of appendicitis.  Refilled adderall for 3 months.

## 2018-09-21 NOTE — Patient Instructions (Signed)
Call back if having worsening pain will get ultrsound.  Consider probiotic for bowel health.  Follow up as needed.

## 2018-09-22 LAB — URINE CULTURE
MICRO NUMBER:: 91317674
SPECIMEN QUALITY: ADEQUATE

## 2018-09-23 NOTE — Progress Notes (Signed)
Call pt: no infection.

## 2018-10-22 ENCOUNTER — Telehealth: Payer: Self-pay

## 2018-10-22 NOTE — Telephone Encounter (Signed)
Katrina Mcclain called today saying that she has a yeast infection. Patient states she has had a lot of yeast infections in the past, and wanted to see if we could go ahead and prescribe something for her, or if she has to come in for an evaluation? Thanks!

## 2018-10-23 MED ORDER — FLUCONAZOLE 150 MG PO TABS: 150.0000 mg | ORAL_TABLET | Freq: Once | ORAL | 0 refills | Status: AC

## 2018-11-13 ENCOUNTER — Other Ambulatory Visit: Payer: Self-pay | Admitting: Physician Assistant

## 2018-11-13 DIAGNOSIS — F5102 Adjustment insomnia: Secondary | ICD-10-CM

## 2018-12-14 ENCOUNTER — Other Ambulatory Visit: Payer: Self-pay | Admitting: Physician Assistant

## 2018-12-14 DIAGNOSIS — F411 Generalized anxiety disorder: Secondary | ICD-10-CM

## 2018-12-14 DIAGNOSIS — F419 Anxiety disorder, unspecified: Secondary | ICD-10-CM

## 2018-12-14 NOTE — Telephone Encounter (Signed)
Last RF 05/23/18  Last OV 09/21/18  RX pended, please review and send if appropriate

## 2019-01-30 IMAGING — DX DG FINGER THUMB 2+V*L*
3 series · 3 of 3 positions shown · non-contrast
Comparison: None.

CLINICAL DATA: Pain in the left thumb for 2 weeks, no injury

EXAM:
LEFT THUMB 2+V

[finger ap]
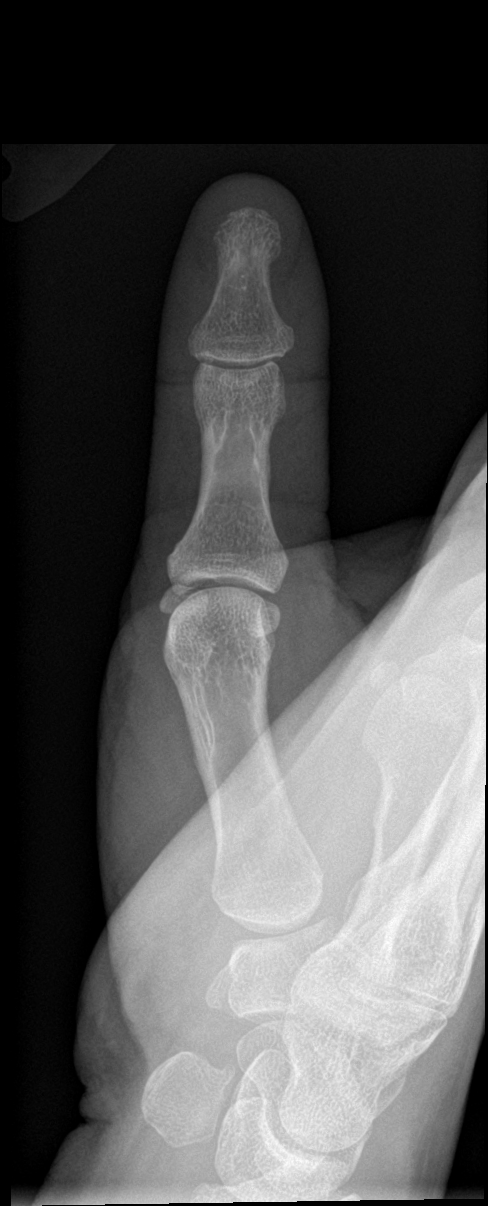

[finger obl]
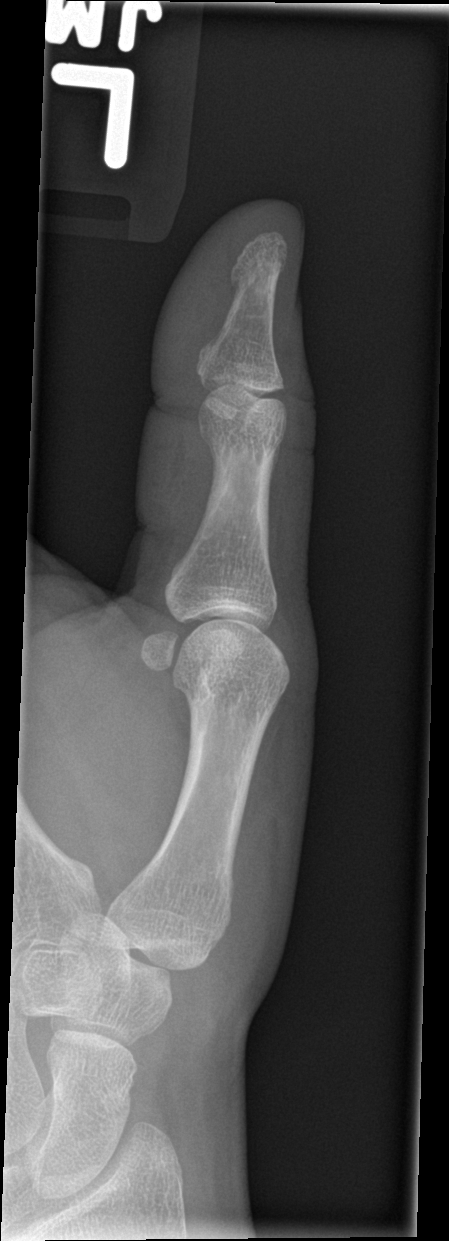

[finger lat]
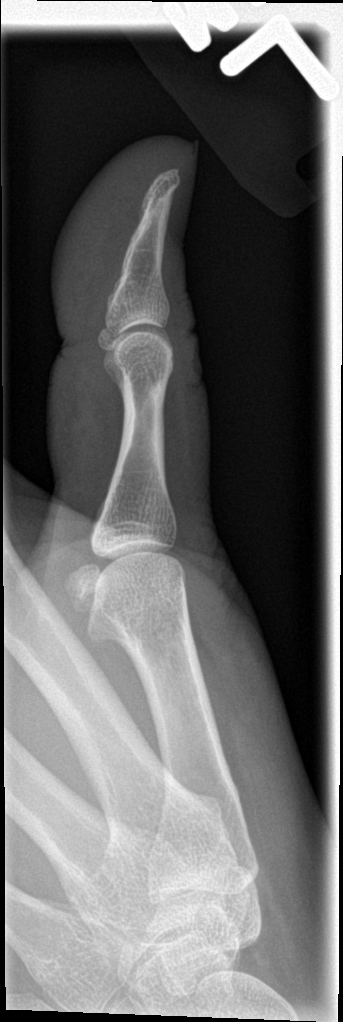

[3 of 3 positions shown; findings below may reference images not displayed]

FINDINGS: Alignment is normal. Joint spaces appear normal. No erosion is seen.
No fracture is noted. There is no evidence of opaque foreign body.
IMPRESSION: Negative.

## 2019-03-04 ENCOUNTER — Other Ambulatory Visit: Payer: Self-pay | Admitting: Physician Assistant

## 2019-03-04 DIAGNOSIS — F330 Major depressive disorder, recurrent, mild: Secondary | ICD-10-CM

## 2019-03-04 DIAGNOSIS — N943 Premenstrual tension syndrome: Secondary | ICD-10-CM

## 2019-03-20 ENCOUNTER — Other Ambulatory Visit: Payer: Self-pay | Admitting: Physician Assistant

## 2019-03-20 DIAGNOSIS — N943 Premenstrual tension syndrome: Secondary | ICD-10-CM

## 2019-03-20 DIAGNOSIS — F330 Major depressive disorder, recurrent, mild: Secondary | ICD-10-CM

## 2019-04-09 ENCOUNTER — Other Ambulatory Visit: Payer: Self-pay | Admitting: Physician Assistant

## 2019-04-09 ENCOUNTER — Other Ambulatory Visit: Payer: Self-pay | Admitting: Osteopathic Medicine

## 2019-04-09 DIAGNOSIS — F411 Generalized anxiety disorder: Secondary | ICD-10-CM

## 2019-04-09 DIAGNOSIS — F5102 Adjustment insomnia: Secondary | ICD-10-CM

## 2019-04-09 DIAGNOSIS — F419 Anxiety disorder, unspecified: Secondary | ICD-10-CM

## 2019-04-09 NOTE — Telephone Encounter (Signed)
Offer virtual visit today.

## 2019-04-09 NOTE — Telephone Encounter (Signed)
Please contact Pt to schedule, thank you.

## 2019-04-09 NOTE — Telephone Encounter (Signed)
Contacted patient to make appointment and per patient she has to check with her insurance and will call us back to see if she wants to schedule this. No further questions at this time.
